# Patient Record
Sex: Female | Born: 1975 | Race: White | Hispanic: No | Marital: Single | Smoking: Never smoker
Health system: Southern US, Community
[De-identification: ages and names within clinical notes are randomized; demographics above are authoritative.]

## PROBLEM LIST (undated history)

## (undated) DIAGNOSIS — E282 Polycystic ovarian syndrome: Secondary | ICD-10-CM

## (undated) DIAGNOSIS — F419 Anxiety disorder, unspecified: Secondary | ICD-10-CM

## (undated) DIAGNOSIS — E119 Type 2 diabetes mellitus without complications: Secondary | ICD-10-CM

## (undated) DIAGNOSIS — I10 Essential (primary) hypertension: Secondary | ICD-10-CM

## (undated) HISTORY — DX: Type 2 diabetes mellitus without complications: E11.9

## (undated) HISTORY — DX: Polycystic ovarian syndrome: E28.2

## (undated) HISTORY — DX: Anxiety disorder, unspecified: F41.9

## (undated) HISTORY — PX: BARTHOLIN GLAND CYST EXCISION: SHX565

## (undated) HISTORY — DX: Essential (primary) hypertension: I10

## (undated) HISTORY — PX: MOUTH SURGERY: SHX715

---

## 2005-11-10 ENCOUNTER — Emergency Department: Payer: Self-pay | Admitting: Emergency Medicine

## 2005-11-10 ENCOUNTER — Other Ambulatory Visit: Payer: Self-pay

## 2007-03-02 ENCOUNTER — Emergency Department: Payer: Self-pay | Admitting: Emergency Medicine

## 2007-03-22 ENCOUNTER — Emergency Department: Payer: Self-pay

## 2012-04-07 ENCOUNTER — Emergency Department: Payer: Self-pay | Admitting: Emergency Medicine

## 2012-04-07 LAB — CK TOTAL AND CKMB (NOT AT ARMC): CK-MB: 0.6 ng/mL (ref 0.5–3.6)

## 2012-04-07 LAB — COMPREHENSIVE METABOLIC PANEL
Alkaline Phosphatase: 59 U/L (ref 50–136)
Anion Gap: 9 (ref 7–16)
BUN: 13 mg/dL (ref 7–18)
Bilirubin,Total: 0.3 mg/dL (ref 0.2–1.0)
Chloride: 104 mmol/L (ref 98–107)
Co2: 28 mmol/L (ref 21–32)
Creatinine: 0.75 mg/dL (ref 0.60–1.30)
EGFR (Non-African Amer.): >60
Osmolality: 281 (ref 275–301)
Potassium: 3.7 mmol/L (ref 3.5–5.1)
SGOT(AST): 21 U/L (ref 15–37)

## 2012-04-07 LAB — TROPONIN I: Troponin-I: <0.02 ng/mL

## 2012-04-07 LAB — CBC
MCH: 27.9 pg (ref 26.0–34.0)
MCHC: 33.8 g/dL (ref 32.0–36.0)
MCV: 82 fL (ref 80–100)
RBC: 4.4 10*6/uL (ref 3.80–5.20)
WBC: 9.9 10*3/uL (ref 3.6–11.0)

## 2013-05-31 IMAGING — CR DG CHEST 2V
1 series · 2 of 2 positions shown · non-contrast
Comparison: none

REASON FOR EXAM: chest pain
COMMENTS:

PROCEDURE:     DXR - DXR CHEST PA (OR AP) AND LATERAL  - April 07, 2012 [DATE]
RESULT:     The lung fields are clear. No pneumonia, pneumothorax or pleural
effusion is seen. Heart size is normal. The mediastinal and osseous
structures reveal no significant abnormalities.

[Series 1: w chest pa · 0.14mm/px · 2 of 2 slices shown]
[im 1/2]
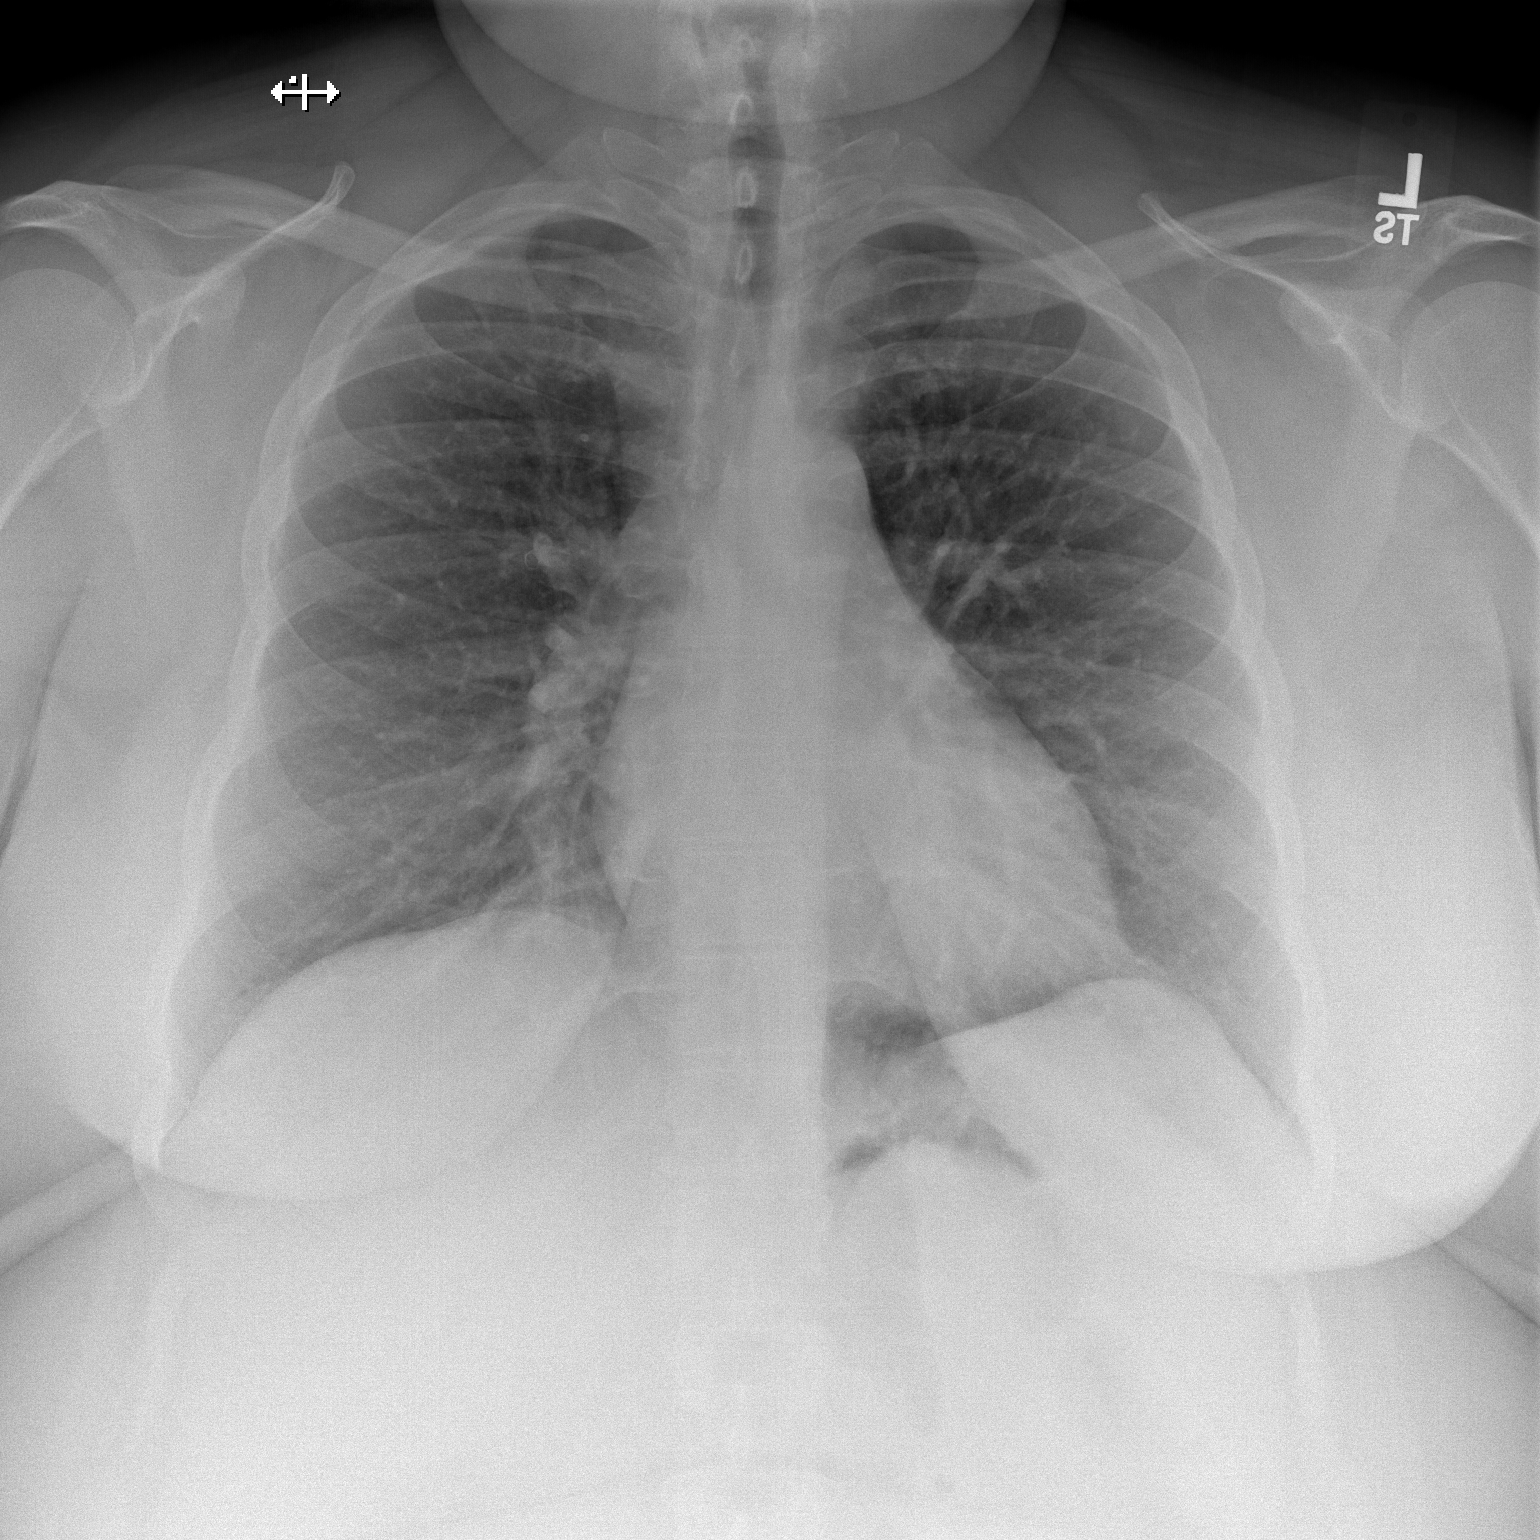
[im 2/2]
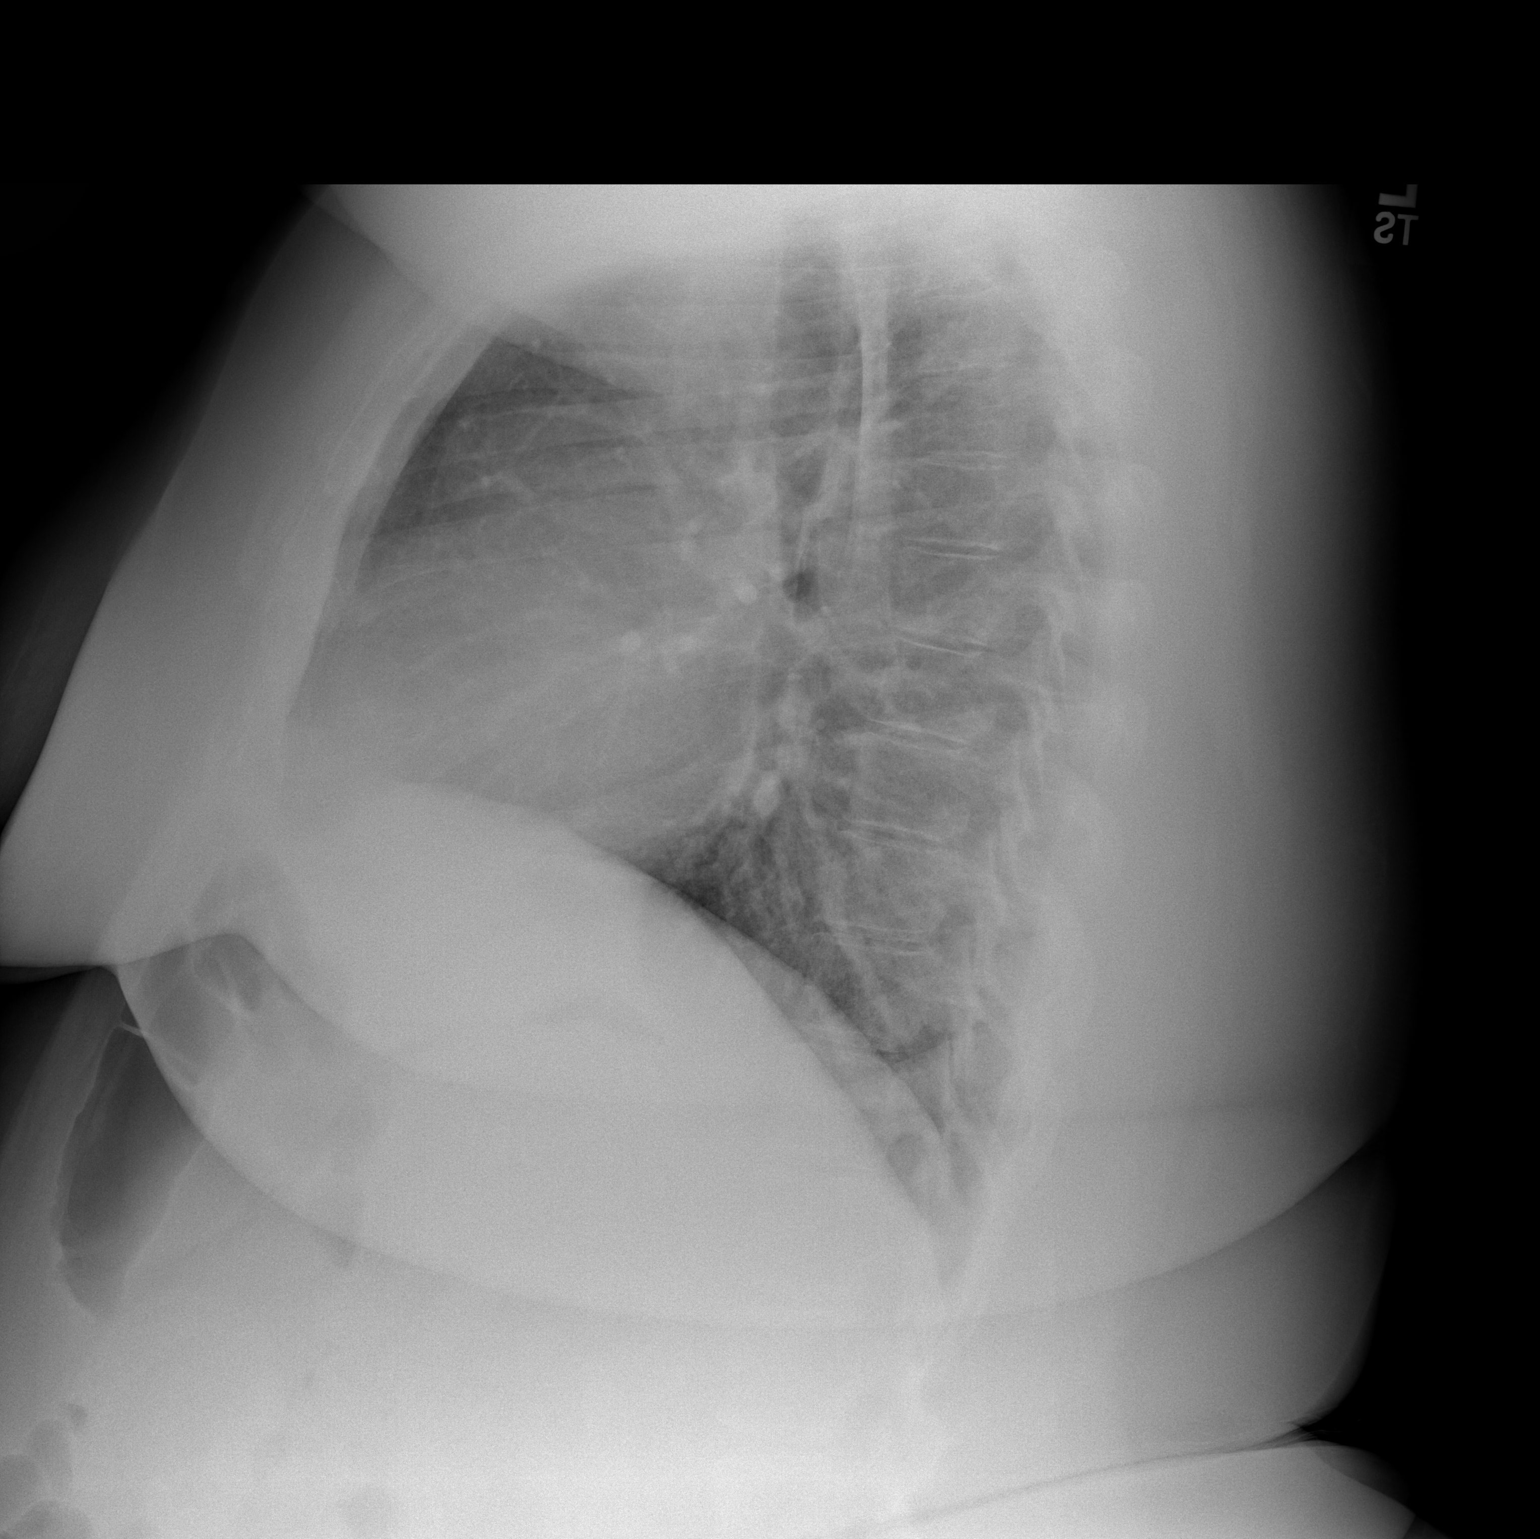

[2 of 2 positions shown; findings below may reference images not displayed]

IMPRESSION: No acute changes are identified.

## 2013-06-01 IMAGING — US ABDOMEN ULTRASOUND LIMITED
1 series · 17 of 25 positions shown · non-contrast
Comparison: none

REASON FOR EXAM: upper abd pain, chest pain - eval for biliary disease
COMMENTS:   Body Site: GB and Fossa, CBD, Head of Pancreas

[Series 1: abdomen ultrasound limited · 17 of 37 slices shown]
[im 1/37]
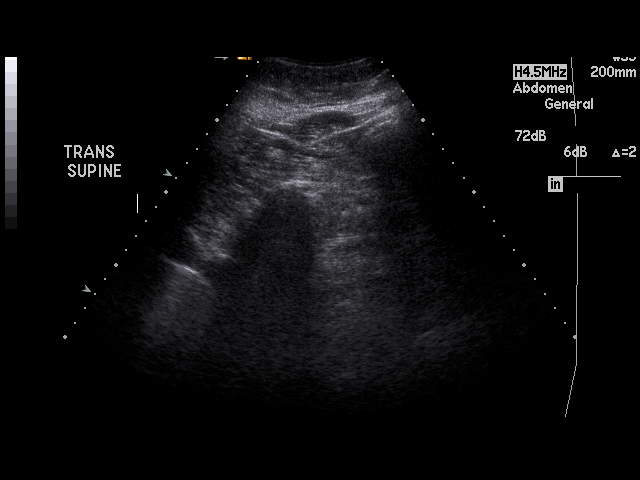
[im 4/37]
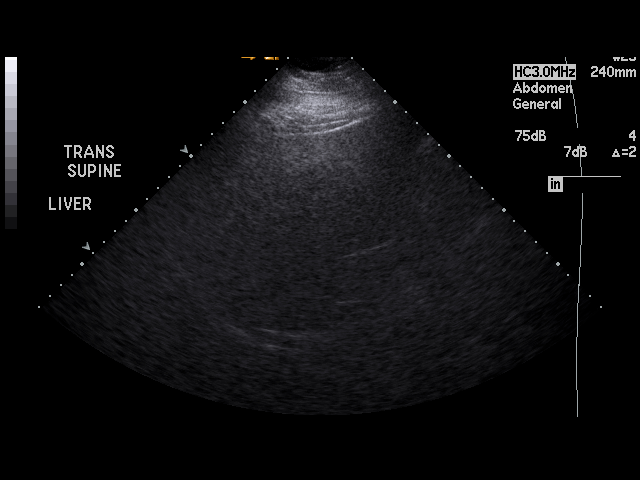
[im 5/37]
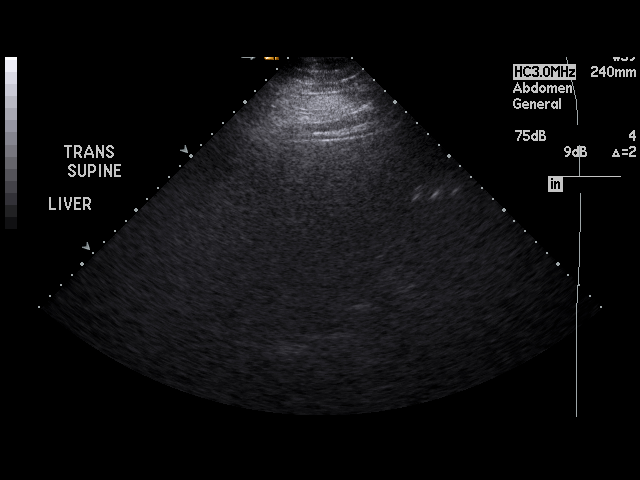
[im 8/37]
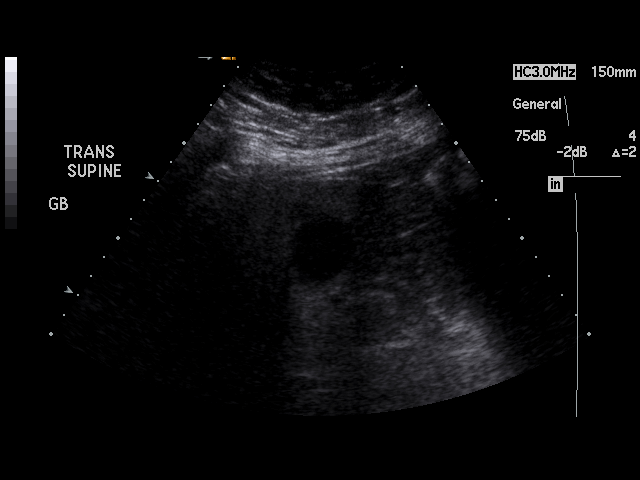
[im 10/37]
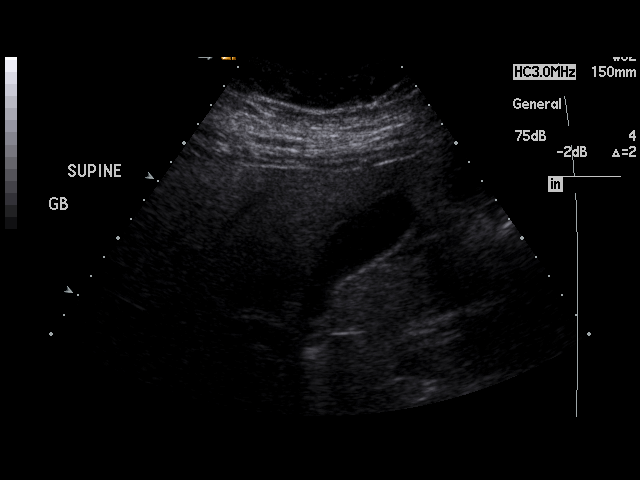
[im 13/37]
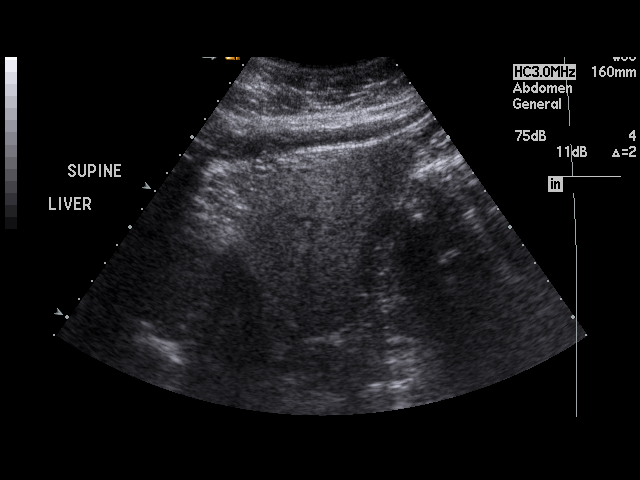
[im 14/37]
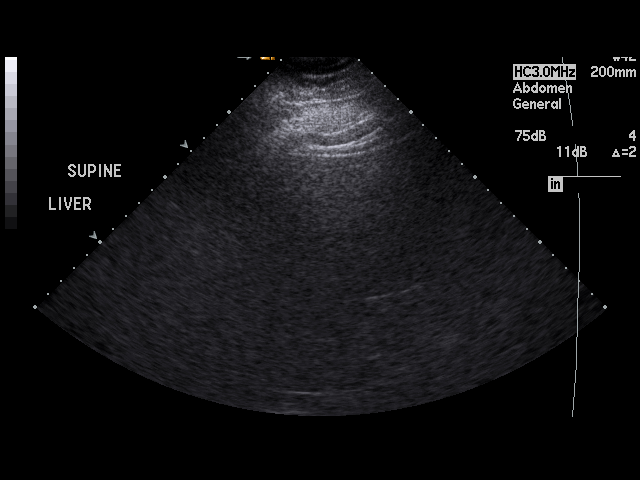
[im 17/37]
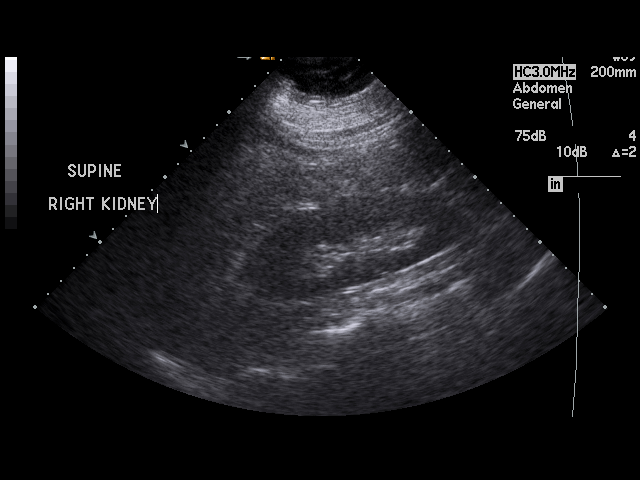
[im 19/37]
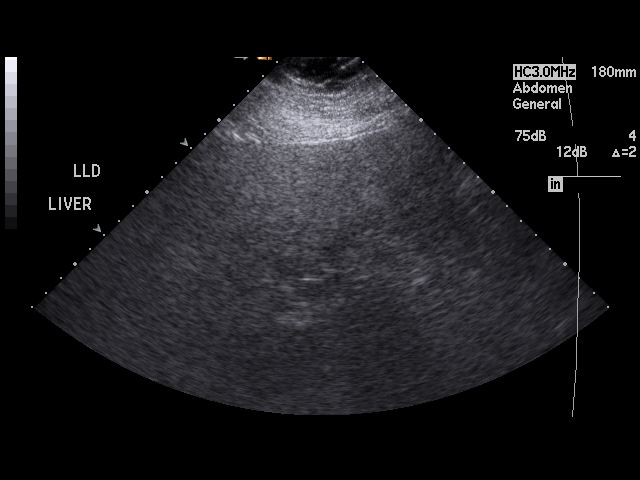
[im 20/37]
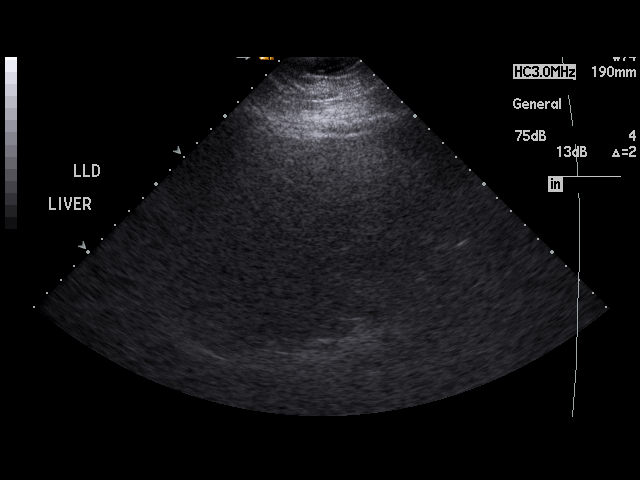
[im 23/37]
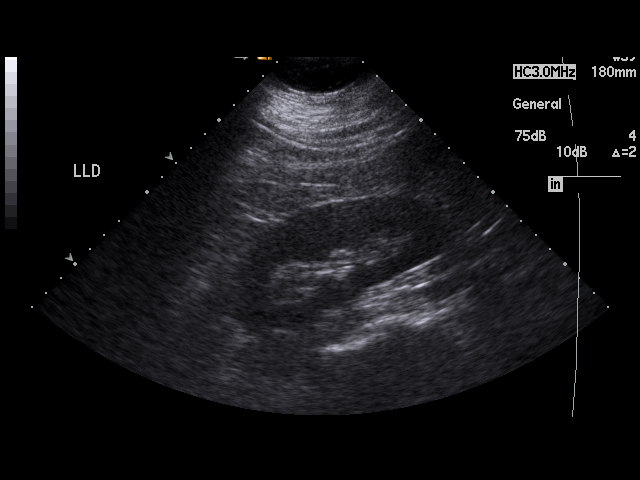
[im 25/37]
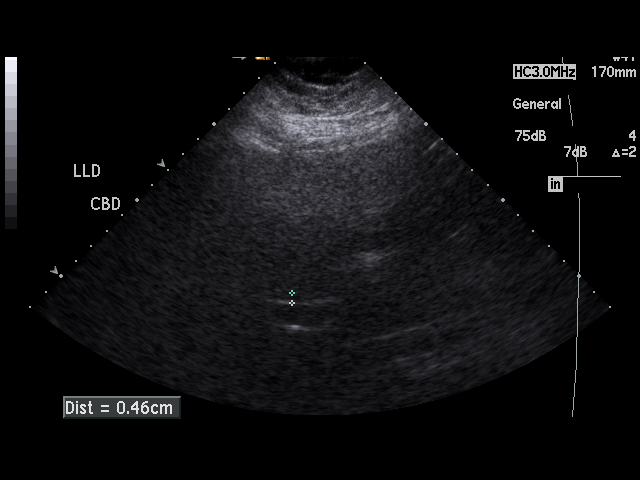
[im 28/37]
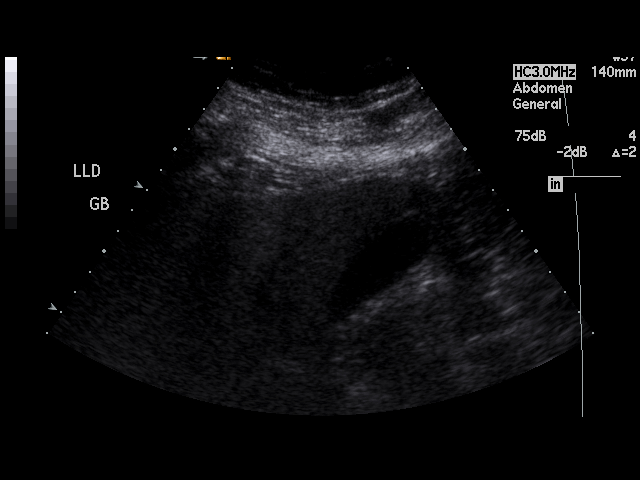
[im 29/37]
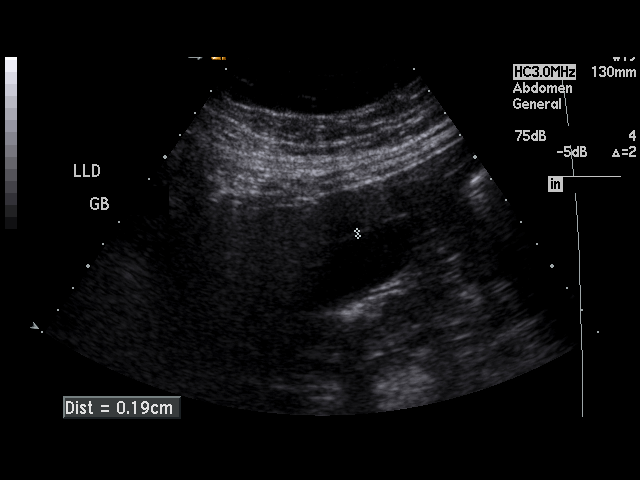
[im 32/37]
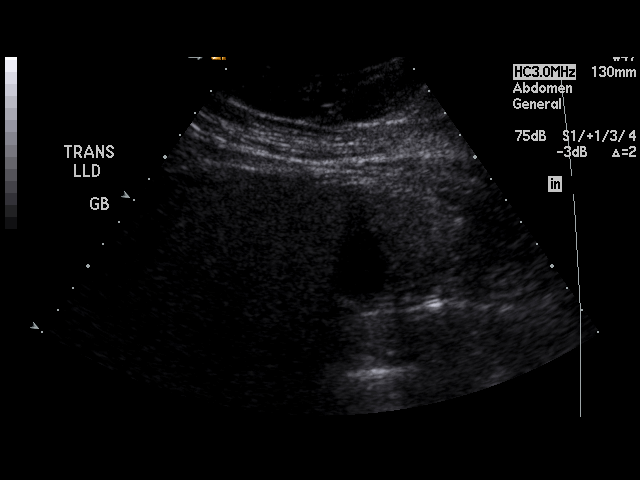
[im 34/37]
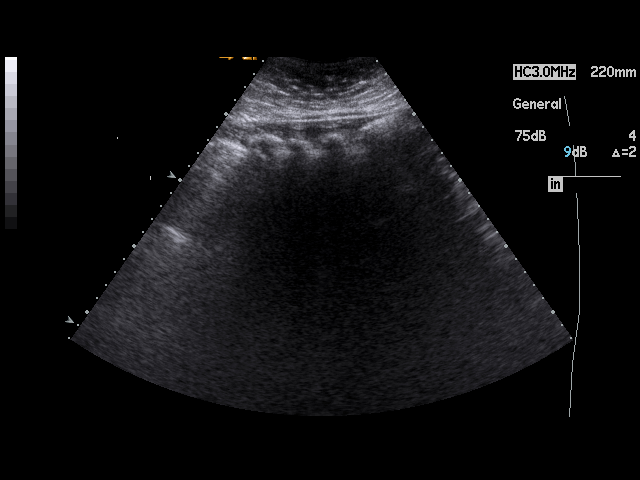
[im 37/37]
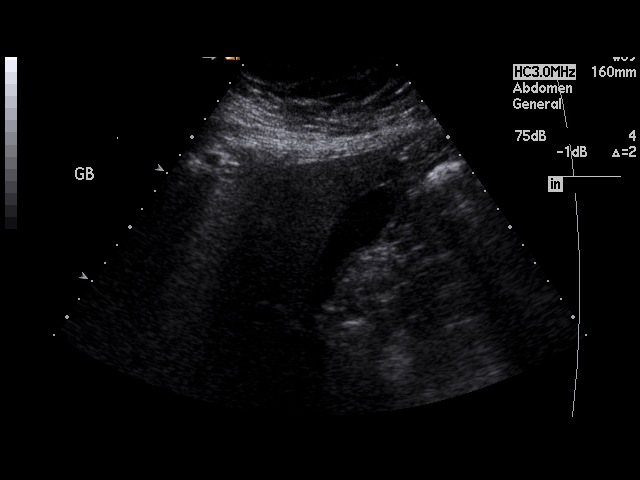

[17 of 25 positions shown; findings below may reference images not displayed]

PROCEDURE:     US  - US ABDOMEN LIMITED SURVEY  - April 08, 2012  [DATE]

RESULT:     This is a difficult exam due to patient body habitus. No
gallstones noted. Gallbladder wall thickening is normal. Negative Murphy's
sign. Common bile duct diameter 5 mm. Liver is echogenic consistent with fat
infiltration. Pancreas not seen. Right kidney unremarkable.
IMPRESSION: No acute abnormality.

## 2013-10-16 ENCOUNTER — Ambulatory Visit: Payer: Self-pay | Admitting: Family Medicine

## 2015-12-28 ENCOUNTER — Ambulatory Visit (INDEPENDENT_AMBULATORY_CARE_PROVIDER_SITE_OTHER): Payer: BC Managed Care – PPO | Admitting: Family Medicine

## 2015-12-28 ENCOUNTER — Encounter: Payer: Self-pay | Admitting: Family Medicine

## 2015-12-28 VITALS — BP 112/74 | HR 84 | Temp 98.7°F | Resp 16 | Ht 65.5 in | Wt 318.0 lb

## 2015-12-28 DIAGNOSIS — G4700 Insomnia, unspecified: Secondary | ICD-10-CM | POA: Diagnosis not present

## 2015-12-28 DIAGNOSIS — R609 Edema, unspecified: Secondary | ICD-10-CM | POA: Insufficient documentation

## 2015-12-28 DIAGNOSIS — F419 Anxiety disorder, unspecified: Secondary | ICD-10-CM

## 2015-12-28 DIAGNOSIS — E789 Disorder of lipoprotein metabolism, unspecified: Secondary | ICD-10-CM | POA: Insufficient documentation

## 2015-12-28 DIAGNOSIS — Z23 Encounter for immunization: Secondary | ICD-10-CM

## 2015-12-28 DIAGNOSIS — R739 Hyperglycemia, unspecified: Secondary | ICD-10-CM | POA: Insufficient documentation

## 2015-12-28 DIAGNOSIS — R5383 Other fatigue: Secondary | ICD-10-CM | POA: Insufficient documentation

## 2015-12-28 DIAGNOSIS — G43909 Migraine, unspecified, not intractable, without status migrainosus: Secondary | ICD-10-CM | POA: Insufficient documentation

## 2015-12-28 DIAGNOSIS — F4320 Adjustment disorder, unspecified: Secondary | ICD-10-CM | POA: Insufficient documentation

## 2015-12-28 DIAGNOSIS — E559 Vitamin D deficiency, unspecified: Secondary | ICD-10-CM | POA: Insufficient documentation

## 2015-12-28 DIAGNOSIS — E282 Polycystic ovarian syndrome: Secondary | ICD-10-CM | POA: Insufficient documentation

## 2015-12-28 MED ORDER — TRAZODONE HCL 50 MG PO TABS: 50.0000 mg | ORAL_TABLET | Freq: Every day | ORAL | Status: DC

## 2015-12-28 MED ORDER — ESCITALOPRAM OXALATE 10 MG PO TABS: 10.0000 mg | ORAL_TABLET | Freq: Every day | ORAL | Status: DC

## 2015-12-28 NOTE — Progress Notes (Signed)
Patient ID: Alexis Miles, female   DOB: May 29, 1976, 40 y.o.   MRN: [id number]        Patient: Alexis Miles Female    DOB: 09/17/1976   39 y.o.   MRN: [id number] Visit Date: 12/28/2015  Today's Provider: Lorie Phenix, MD   Chief Complaint  Patient presents with  . Anxiety   Subjective:    Anxiety Presents for follow-up visit. The problem has been gradually worsening (Especially in the last several weeks. ). Symptoms include decreased concentration, excessive worry, insomnia, irritability, nausea, nervous/anxious behavior and palpitations (When she is anxious). Patient reports no chest pain, compulsions, confusion, depressed mood, dizziness, panic, restlessness, shortness of breath or suicidal ideas. Symptoms occur constantly. The severity of symptoms is interfering with daily activities. The symptoms are aggravated by work stress (Pt reports the holidays were difficult). The quality of sleep is poor. Nighttime awakenings: several.         Allergies  Allergen Reactions  . Penicillins    Previous Medications   ALPRAZOLAM (XANAX) 0.5 MG TABLET    Take by mouth. Reported on 12/28/2015   CHOLECALCIFEROL (VITAMIN D) 2000 UNITS CAPS    Take 1 capsule by mouth daily. Reported on 12/28/2015   ERGOCALCIFEROL (DRISDOL) 50000 UNITS CAPSULE    Take 1 capsule by mouth once a week. Reported on 12/28/2015   FLUOXETINE (PROZAC) 40 MG CAPSULE    Take 1 Can by mouth daily. Reported on 12/28/2015   METFORMIN (GLUCOPHAGE) 500 MG TABLET    Take 1 tablet by mouth 2 (two) times daily. Reported on 12/28/2015    Review of Systems  Constitutional: Positive for irritability and fatigue. Negative for fever, chills, diaphoresis, activity change, appetite change and unexpected weight change.  Respiratory: Negative.  Negative for cough, choking, shortness of breath, wheezing and stridor.   Cardiovascular: Positive for palpitations (When she is anxious). Negative for chest pain and leg swelling.    Gastrointestinal: Positive for nausea, abdominal pain, diarrhea and constipation. Negative for vomiting, blood in stool, abdominal distention, anal bleeding and rectal pain.  Endocrine: Positive for cold intolerance and heat intolerance.  Neurological: Positive for light-headedness. Negative for dizziness and headaches.  Psychiatric/Behavioral: Positive for sleep disturbance and decreased concentration. Negative for suicidal ideas, hallucinations, behavioral problems, confusion, self-injury, dysphoric mood and agitation. The patient is nervous/anxious and has insomnia. The patient is not hyperactive.     Social History  Substance Use Topics  . Smoking status: Never Smoker   . Smokeless tobacco: Never Used  . Alcohol Use: No   Objective:   BP 112/74 mmHg  Pulse 84  Temp(Src) 98.7 F (37.1 C) (Oral)  Resp 16  Ht 5' 5.5" (1.664 m)  Wt 318 lb (144.244 kg)  BMI 52.09 kg/m2  LMP 11/27/2015 (Within Weeks)  Physical Exam  Constitutional: She is oriented to person, place, and time. She appears well-developed and well-nourished.  Cardiovascular: Normal rate and regular rhythm.   Pulmonary/Chest: Effort normal and breath sounds normal.  Neurological: She is alert and oriented to person, place, and time.  Psychiatric: She has a normal mood and affect. Her behavior is normal. Judgment and thought content normal.  Tearful when talking about her mother.        Assessment & Plan:     1. Flu vaccine need Given today.   - Flu Vaccine QUAD 36+ mos PF IM (Fluarix & Fluzone Quad PF)  2. Anxiety Recurrent. Will start medication as below. Patient instructed to call back if condition worsens  or does not improve.   Recheck in 4 weeks.  - escitalopram (LEXAPRO) 10 MG tablet; Take 1 tablet (10 mg total) by mouth daily. Start with  a day for seven day, then increase to  a day.  Dispense: 30 tablet; Refill: 5 - traZODone (DESYREL) 50 MG tablet; Take 1 tablet (50 mg total) by mouth at bedtime.   Dispense: 30 tablet; Refill: 5  3. Insomnia Will start Trazodone at night.    - escitalopram (LEXAPRO) 10 MG tablet; Take 1 tablet (10 mg total) by mouth daily. Start with  a day for seven day, then increase to  a day.  Dispense: 30 tablet; Refill: 5 - traZODone (DESYREL) 50 MG tablet; Take 1 tablet (50 mg total) by mouth at bedtime.  Dispense: 30 tablet; Refill: 5  Patient was seen and examined by Leo Grosser, MD, and note scribed by Kavin Leech, CMA.  I have reviewed the document for accuracy and completeness and I agree with above. - Leo Grosser, MD      Lorie Phenix, MD  [facility]

## 2016-01-27 ENCOUNTER — Ambulatory Visit (INDEPENDENT_AMBULATORY_CARE_PROVIDER_SITE_OTHER): Payer: BC Managed Care – PPO | Admitting: Family Medicine

## 2016-01-27 ENCOUNTER — Encounter: Payer: Self-pay | Admitting: Family Medicine

## 2016-01-27 VITALS — BP 122/78 | HR 68 | Temp 98.3°F | Resp 16 | Wt 317.0 lb

## 2016-01-27 DIAGNOSIS — F419 Anxiety disorder, unspecified: Secondary | ICD-10-CM

## 2016-01-27 MED ORDER — ESCITALOPRAM OXALATE 10 MG PO TABS: 15.0000 mg | ORAL_TABLET | Freq: Every day | ORAL | Status: DC

## 2016-01-27 NOTE — Progress Notes (Signed)
Subjective:    Patient ID: Alexis Miles, female    DOB: [birth year], 40 y.o.   MRN: [id number]  Anxiety Presents for follow-up (Started on Lexapro at LOV) visit. The problem has been gradually improving (pt reports thi sproblem is 75% improved since LOV). Symptoms include decreased concentration, depressed mood, nausea, nervous/anxious behavior and restlessness. Patient reports no chest pain, compulsions, confusion, dizziness, dry mouth, excessive worry, feeling of choking, hyperventilation, insomnia, irritability, malaise, muscle tension, obsessions, palpitations, panic, shortness of breath or suicidal ideas. The severity of symptoms is mild. Exacerbated by: particular stressors. The quality of sleep is good.   Past treatments include SSRIs. The treatment provided significant relief. Compliance with prior treatments has been good.  Insomnia Primary symptoms: no fragmented sleep, sleep disturbance (wakes up about once during the night, has improved since LOV), difficulty falling asleep (improved), somnolence, no frequent awakening, premature morning awakening, malaise/fatigue, no napping.   Episode onset: FU from 12/28/2015. Pt was started on Trazodone at LOV. The problem has been gradually improving since onset. Past treatments include medication (Trazodone). The treatment provided no relief (pt reports the Trazodone was causing side effects, including suididal ideation). Typical bedtime:  10-11 P.M..       Review of Systems  Constitutional: Positive for malaise/fatigue. Negative for irritability.  Respiratory: Negative for shortness of breath.   Cardiovascular: Negative for chest pain and palpitations.  Gastrointestinal: Positive for nausea.  Neurological: Negative for dizziness.  Psychiatric/Behavioral: Positive for sleep disturbance (wakes up about once during the night, has improved since LOV) and decreased concentration. Negative for suicidal ideas and confusion. The patient is  nervous/anxious. The patient does not have insomnia.    BP 122/78 mmHg  Pulse 68  Temp(Src) 98.3 F (36.8 C) (Oral)  Resp 16  Wt 317 lb (143.79 kg)  LMP 01/01/2016 (Within Days)   Patient Active Problem List   Diagnosis Date Noted  . Anxiety 12/28/2015  . Borderline high serum cholesterol 12/28/2015  . Accumulation of fluid in tissues 12/28/2015  . Fatigue 12/28/2015  . Blood glucose elevated 12/28/2015  . Migraine 12/28/2015  . Polycystic ovaries 12/28/2015  . Avitaminosis D 12/28/2015  . Insomnia 12/28/2015   Past Medical History  Diagnosis Date  . Anxiety    Current Outpatient Prescriptions on File Prior to Visit  Medication Sig  . metFORMIN (GLUCOPHAGE) 500 MG tablet Take 1 tablet by mouth 2 (two) times daily. Reported on 01/27/2016   No current facility-administered medications on file prior to visit.   Allergies  Allergen Reactions  . Penicillins   . Trazodone And Nefazodone Other (See Comments)    Suicidal Ideation   Past Surgical History  Procedure Laterality Date  . Mouth surgery      wisdom teeth removed   Social History   Social History  . Marital Status: Single    Spouse Name: N/A  . Number of Children: 0  . Years of Education: College   Occupational History  . Full Time    Social History Main Topics  . Smoking status: Never Smoker   . Smokeless tobacco: Never Used  . Alcohol Use: No  . Drug Use: No  . Sexual Activity: Not on file   Other Topics Concern  . Not on file   Social History Narrative   Family History  Problem Relation Age of Onset  . Arthritis Mother   . Lymphoma Mother   . Heart disease Father     MI, CABG, CAD  . Diabetes Father   .  Hyperlipidemia Sister   . Depression Sister   . Anxiety disorder Sister   . Cancer Maternal Grandmother     colon, breast   . Diabetes Maternal Grandmother   . Alcohol abuse Paternal Grandfather   . Hypertension Sister   . Depression Sister   . Anxiety disorder Sister   . Diabetes  Maternal Aunt   . Lymphoma Maternal Aunt       Objective:   Physical Exam  Constitutional: She is oriented to person, place, and time. She appears well-developed and well-nourished.  Neurological: She is alert and oriented to person, place, and time.  Psychiatric: She has a normal mood and affect. Her behavior is normal. Judgment and thought content normal.   BP 122/78 mmHg  Pulse 68  Temp(Src) 98.3 F (36.8 C) (Oral)  Resp 16  Wt 317 lb (143.79 kg)  LMP 01/01/2016 (Within Days)     Assessment & Plan:  1. Anxiety Improved but not at goal.  Will increase to 15 mg and recheck at CPE.    - escitalopram (LEXAPRO) 10 MG tablet; Take 1.5 tablets (15 mg total) by mouth daily.  Dispense: 45 tablet; Refill: 5   Patient was seen and examined by Leo Grosser, MD, and note scribed by Allene Dillon, CMA. I have reviewed the document for accuracy and completeness and I agree with above. Leo Grosser, MD  Lorie Phenix, MD

## 2016-02-24 ENCOUNTER — Ambulatory Visit: Payer: BC Managed Care – PPO

## 2016-03-01 ENCOUNTER — Other Ambulatory Visit: Payer: Self-pay | Admitting: Family Medicine

## 2016-03-01 DIAGNOSIS — F419 Anxiety disorder, unspecified: Secondary | ICD-10-CM

## 2016-03-01 MED ORDER — ESCITALOPRAM OXALATE 10 MG PO TABS: 15.0000 mg | ORAL_TABLET | Freq: Every day | ORAL | Status: DC

## 2016-03-01 NOTE — Telephone Encounter (Signed)
Pt said you had increased her escitalopram (LEXAPRO) 10 MG tablet To 15 mg .    She needs an early refill because of the increase.  She has ran out.  She has been taking 1 and 1/2 tablets.  She uses [facility] [address]  Her call back is at work and is [phone]  Thanks Barth Kirkseri

## 2016-03-12 ENCOUNTER — Encounter: Payer: BC Managed Care – PPO | Admitting: Family Medicine

## 2016-05-04 ENCOUNTER — Encounter: Payer: Self-pay | Admitting: Family Medicine

## 2016-05-04 ENCOUNTER — Ambulatory Visit (INDEPENDENT_AMBULATORY_CARE_PROVIDER_SITE_OTHER): Payer: BC Managed Care – PPO | Admitting: Family Medicine

## 2016-05-04 VITALS — BP 128/72 | HR 80 | Temp 98.1°F | Resp 16 | Ht 65.0 in | Wt 333.0 lb

## 2016-05-04 DIAGNOSIS — F419 Anxiety disorder, unspecified: Secondary | ICD-10-CM | POA: Diagnosis not present

## 2016-05-04 DIAGNOSIS — R739 Hyperglycemia, unspecified: Secondary | ICD-10-CM

## 2016-05-04 DIAGNOSIS — E559 Vitamin D deficiency, unspecified: Secondary | ICD-10-CM | POA: Diagnosis not present

## 2016-05-04 DIAGNOSIS — E789 Disorder of lipoprotein metabolism, unspecified: Secondary | ICD-10-CM | POA: Diagnosis not present

## 2016-05-04 DIAGNOSIS — Z0000 Encounter for general adult medical examination without abnormal findings: Secondary | ICD-10-CM | POA: Diagnosis not present

## 2016-05-04 NOTE — Progress Notes (Signed)
Patient: Alexis Miles, Female    DOB: [birth year], 40 y.o.   MRN: [id number] Visit Date: 05/04/2016  Today's Provider: Lorie PhenixNancy Dinora Hemm, MD   Chief Complaint  Patient presents with  . Annual Exam  . Anxiety   Subjective:    Annual physical exam Alexis Miles is a 40 y.o. female who presents today for health maintenance and complete physical. She feels fairly well. She reports exercising never. She reports she is sleeping fairly well.  -----------------------------------------------------------------  Follow up for Anxiety  The patient was last seen for this 3 months ago. Changes made at last visit include increasing Lexapro to 15 mg.  She reports excellent compliance with treatment. She feels that condition is Improved. Pt reports she is 90% improved. She is not having side effects.   ------------------------------------------------------------------------------------      Review of Systems  Constitutional: Positive for fatigue.  Respiratory: Positive for cough (improving).   Genitourinary: Positive for urgency.  Neurological: Positive for headaches.  Psychiatric/Behavioral: The patient is nervous/anxious.   All other systems reviewed and are negative.   Social History      She  reports that she has never smoked. She has never used smokeless tobacco. She reports that she drinks alcohol. She reports that she does not use illicit drugs.       Social History   Social History  . Marital Status: Single    Spouse Name: N/A  . Number of Children: 0  . Years of Education: College   Occupational History  . Full Time Abss    Engineer, [employer]   Social History Main Topics  . Smoking status: Never Smoker   . Smokeless tobacco: Never Used  . Alcohol Use: Yes     Comment: Rare  . Drug Use: No  . Sexual Activity: No     Comment: Never sexually active   Other Topics Concern  . None   Social History Narrative    Past Medical History    Diagnosis Date  . Anxiety      Patient Active Problem List   Diagnosis Date Noted  . Anxiety 12/28/2015  . Borderline high serum cholesterol 12/28/2015  . Accumulation of fluid in tissues 12/28/2015  . Fatigue 12/28/2015  . Blood glucose elevated 12/28/2015  . Migraine 12/28/2015  . Polycystic ovaries 12/28/2015  . Avitaminosis D 12/28/2015  . Insomnia 12/28/2015    Past Surgical History  Procedure Laterality Date  . Mouth surgery      wisdom teeth removed  . Bartholin gland cyst excision      Family History        Family Status  Relation Status Death Age  . Mother Deceased   . Father Alive   . Sister Alive   . Maternal Grandmother Deceased   . Paternal Grandfather Deceased     MI  . Sister Alive         Her family history includes Alcohol abuse in her paternal grandfather; Anxiety disorder in her sister and sister; Arthritis in her mother; Cancer in her maternal grandmother; Depression in her sister and sister; Diabetes in her father, maternal aunt, and maternal grandmother; Heart disease in her father; Hyperlipidemia in her sister; Hypertension in her sister; Lymphoma in her maternal aunt and mother.    Allergies  Allergen Reactions  . Penicillins   . Trazodone And Nefazodone Other (See Comments)    Suicidal Ideation    Previous Medications   ACETAMINOPHEN (TYLENOL)  500 MG TABLET    Take 500 mg by mouth every 6 (six) hours as needed.   ESCITALOPRAM (LEXAPRO) 10 MG TABLET    Take 1.5 tablets (15 mg total) by mouth daily.    Patient Care Team: Lorie Phenix, MD as PCP - General (Family Medicine)     Objective:   Vitals: BP 128/72 mmHg  Pulse 80  Temp(Src) 98.1 F (36.7 C) (Oral)  Resp 16  Ht  (1.651 m)  Wt 333 lb (151.048 kg)  BMI 55.41 kg/m2  LMP 04/30/2016   Physical Exam  Constitutional: She is oriented to person, place, and time. She appears well-developed and well-nourished.  HENT:  Head: Normocephalic and atraumatic.  Right Ear:  Tympanic membrane, external ear and ear canal normal.  Left Ear: Tympanic membrane, external ear and ear canal normal.  Nose: Nose normal.  Mouth/Throat: Uvula is midline, oropharynx is clear and moist and mucous membranes are normal.  Eyes: Conjunctivae, EOM and lids are normal. Pupils are equal, round, and reactive to light.  Neck: Trachea normal and normal range of motion. Neck supple. Carotid bruit is not present. No thyroid mass and no thyromegaly present.  Cardiovascular: Normal rate, regular rhythm and normal heart sounds.   Pulmonary/Chest: Effort normal and breath sounds normal.  Abdominal: Soft. Normal appearance and bowel sounds are normal. There is no hepatosplenomegaly. There is no tenderness.  Musculoskeletal: Normal range of motion.  Lymphadenopathy:    She has no cervical adenopathy.    She has no axillary adenopathy.  Neurological: She is alert and oriented to person, place, and time. She has normal strength. No cranial nerve deficit.  Skin: Skin is warm, dry and intact.  Psychiatric: She has a normal mood and affect. Her speech is normal and behavior is normal. Judgment and thought content normal. Cognition and memory are normal.     Depression Screen PHQ 2/9 Scores 05/04/2016  PHQ - 2 Score 1      Assessment & Plan:     Routine Health Maintenance and Physical Exam  Exercise Activities and Dietary recommendations Goals    None      Immunization History  Administered Date(s) Administered  . Influenza,inj,Quad PF,36+ Mos 12/28/2015    Health Maintenance  Topic Date Due  . HIV Screening  05/28/1993  . TETANUS/TDAP  05/28/1997  . PAP SMEAR  05/29/1999  . INFLUENZA VACCINE  07/10/2016      Discussed health benefits of physical activity, and encouraged her to engage in regular exercise appropriate for her age and condition.    -------------------------------------------------------------------- 1. Annual physical exam Stable. As above. Will defer pap.  Has never been sexually active. Has had 2 normal paps in the past.  2. Anxiety Improving. Continue 15 mg of Lexapro daily. Call if sx worsen. - TSH  3. Avitaminosis D Pt has H/O this. Not taking supplements. FU pending results. - VITAMIN D 25 Hydroxy (Vit-D Deficiency, Fractures)  4. Blood glucose elevated Pt has H/O this. No longer taking Metformin. FU pending results. - Hemoglobin A1c  5. Borderline high serum cholesterol Pt has H/O this. FU pending results. - Lipid panel - CBC with Differential/Platelet - Comprehensive metabolic panel   6. Morbid obesity, unspecified obesity type (HCC) Stressed importance of weight loss. Recommended think about bypass surgery.    Patient seen and examined by Leo Grosser, MD, and note scribed by Allene Dillon, CMA.  I have reviewed the document for accuracy and completeness and I agree with above. Harriett Sine  Adele Barthel, MD   Lorie Phenix, MD

## 2016-05-22 ENCOUNTER — Telehealth: Payer: Self-pay

## 2016-05-22 LAB — COMPREHENSIVE METABOLIC PANEL
A/G RATIO: 1.4 (ref 1.2–2.2)
ALT: 22 IU/L (ref 0–32)
AST: 14 IU/L (ref 0–40)
Albumin: 4.1 g/dL (ref 3.5–5.5)
Alkaline Phosphatase: 60 IU/L (ref 39–117)
BUN / CREAT RATIO: 21 (ref 9–23)
BUN: 15 mg/dL (ref 6–20)
Bilirubin Total: 0.6 mg/dL (ref 0.0–1.2)
CALCIUM: 9.3 mg/dL (ref 8.7–10.2)
CHLORIDE: 101 mmol/L (ref 96–106)
CO2: 26 mmol/L (ref 18–29)
Creatinine, Ser: 0.72 mg/dL (ref 0.57–1.00)
GFR calc non Af Amer: 107 mL/min/{1.73_m2} (ref >59)
GFR, EST AFRICAN AMERICAN: 124 mL/min/{1.73_m2} (ref >59)
GLOBULIN, TOTAL: 2.9 g/dL (ref 1.5–4.5)
Glucose: 102 mg/dL — ABNORMAL HIGH (ref 65–99)
POTASSIUM: 4.8 mmol/L (ref 3.5–5.2)
Sodium: 141 mmol/L (ref 134–144)
Total Protein: 7 g/dL (ref 6.0–8.5)

## 2016-05-22 LAB — CBC WITH DIFFERENTIAL/PLATELET
BASOS: 0 %
Basophils Absolute: 0 10*3/uL (ref 0.0–0.2)
EOS (ABSOLUTE): 0.1 10*3/uL (ref 0.0–0.4)
EOS: 1 %
HEMATOCRIT: 39.8 % (ref 34.0–46.6)
Hemoglobin: 13.2 g/dL (ref 11.1–15.9)
IMMATURE GRANULOCYTES: 0 %
Immature Grans (Abs): 0 10*3/uL (ref 0.0–0.1)
LYMPHS ABS: 2.2 10*3/uL (ref 0.7–3.1)
Lymphs: 32 %
MCH: 27 pg (ref 26.6–33.0)
MCHC: 33.2 g/dL (ref 31.5–35.7)
MCV: 82 fL (ref 79–97)
MONOS ABS: 0.5 10*3/uL (ref 0.1–0.9)
Monocytes: 8 %
NEUTROS ABS: 3.9 10*3/uL (ref 1.4–7.0)
Neutrophils: 59 %
Platelets: 289 10*3/uL (ref 150–379)
RBC: 4.88 x10E6/uL (ref 3.77–5.28)
RDW: 14.5 % (ref 12.3–15.4)
WBC: 6.7 10*3/uL (ref 3.4–10.8)

## 2016-05-22 LAB — LIPID PANEL
CHOL/HDL RATIO: 2.9 ratio (ref 0.0–4.4)
Cholesterol, Total: 192 mg/dL (ref 100–199)
HDL: 66 mg/dL (ref >39)
LDL CALC: 106 mg/dL — AB (ref 0–99)
Triglycerides: 98 mg/dL (ref 0–149)
VLDL CHOLESTEROL CAL: 20 mg/dL (ref 5–40)

## 2016-05-22 LAB — TSH: TSH: 2.13 u[IU]/mL (ref 0.450–4.500)

## 2016-05-22 LAB — VITAMIN D 25 HYDROXY (VIT D DEFICIENCY, FRACTURES): Vit D, 25-Hydroxy: 18.4 ng/mL — ABNORMAL LOW (ref 30.0–100.0)

## 2016-05-22 LAB — HEMOGLOBIN A1C
Est. average glucose Bld gHb Est-mCnc: 114 mg/dL
Hgb A1c MFr Bld: 5.6 % (ref 4.8–5.6)

## 2016-05-22 NOTE — Telephone Encounter (Signed)
lmtcb Neida Ellegood Drozdowski, CMA  

## 2016-05-22 NOTE — Telephone Encounter (Signed)
-----   Message from Lorie PhenixNancy Maloney, MD sent at 05/22/2016  7:23 AM EDT ----- Labs stable except for low vit D. Take Vit D 2000 iu daily. Thanks.

## 2016-05-22 NOTE — Telephone Encounter (Signed)
Patient advised as below.  

## 2017-02-07 ENCOUNTER — Encounter: Payer: Self-pay | Admitting: Physician Assistant

## 2017-02-07 ENCOUNTER — Ambulatory Visit (INDEPENDENT_AMBULATORY_CARE_PROVIDER_SITE_OTHER): Payer: BC Managed Care – PPO | Admitting: Physician Assistant

## 2017-02-07 VITALS — BP 128/80 | HR 80 | Temp 98.0°F | Resp 16 | Wt 358.0 lb

## 2017-02-07 DIAGNOSIS — R7303 Prediabetes: Secondary | ICD-10-CM

## 2017-02-07 DIAGNOSIS — E282 Polycystic ovarian syndrome: Secondary | ICD-10-CM

## 2017-02-07 NOTE — Patient Instructions (Signed)
Diabetes Mellitus and Exercise Exercising regularly is important for your overall health, especially when you have diabetes (diabetes mellitus). Exercising is not only about losing weight. It has many health benefits, such as increasing muscle strength and bone density and reducing body fat and stress. This leads to improved fitness, flexibility, and endurance, all of which result in better overall health. Exercise has additional benefits for people with diabetes, including:  Reducing appetite.  Helping to lower and control blood glucose.  Lowering blood pressure.  Helping to control amounts of fatty substances (lipids) in the blood, such as cholesterol and triglycerides.  Helping the body to respond better to insulin (improving insulin sensitivity).  Reducing how much insulin the body needs.  Decreasing the risk for heart disease by:  Lowering cholesterol and triglyceride levels.  Increasing the levels of good cholesterol.  Lowering blood glucose levels. What is my activity plan? Your health care provider or certified diabetes educator can help you make a plan for the type and frequency of exercise (activity plan) that works for you. Make sure that you:  Do at least 150 minutes of moderate-intensity or vigorous-intensity exercise each week. This could be brisk walking, biking, or water aerobics.  Do stretching and strength exercises, such as yoga or weightlifting, at least 2 times a week.  Spread out your activity over at least 3 days of the week.  Get some form of physical activity every day.  Do not go more than 2 days in a row without some kind of physical activity.  Avoid being inactive for more than 90 minutes at a time. Take frequent breaks to walk or stretch.  Choose a type of exercise or activity that you enjoy, and set realistic goals.  Start slowly, and gradually increase the intensity of your exercise over time. What do I need to know about managing my  diabetes?  Check your blood glucose before and after exercising.  If your blood glucose is higher than 240 mg/dL (13.3 mmol/L) before you exercise, check your urine for ketones. If you have ketones in your urine, do not exercise until your blood glucose returns to normal.  Know the symptoms of low blood glucose (hypoglycemia) and how to treat it. Your risk for hypoglycemia increases during and after exercise. Common symptoms of hypoglycemia can include:  Hunger.  Anxiety.  Sweating and feeling clammy.  Confusion.  Dizziness or feeling light-headed.  Increased heart rate or palpitations.  Blurry vision.  Tingling or numbness around the mouth, lips, or tongue.  Tremors or shakes.  Irritability.  Keep a rapid-acting carbohydrate snack available before, during, and after exercise to help prevent or treat hypoglycemia.  Avoid injecting insulin into areas of the body that are going to be exercised. For example, avoid injecting insulin into:  The arms, when playing tennis.  The legs, when jogging.  Keep records of your exercise habits. Doing this can help you and your health care provider adjust your diabetes management plan as needed. Write down:  Food that you eat before and after you exercise.  Blood glucose levels before and after you exercise.  The type and amount of exercise you have done.  When your insulin is expected to peak, if you use insulin. Avoid exercising at times when your insulin is peaking.  When you start a new exercise or activity, work with your health care provider to make sure the activity is safe for you, and to adjust your insulin, medicines, or food intake as needed.  Drink plenty   of water while you exercise to prevent dehydration or heat stroke. Drink enough fluid to keep your urine clear or pale yellow. This information is not intended to replace advice given to you by your health care provider. Make sure you discuss any questions you have with  your health care provider. Document Released: 02/16/2004 Document Revised: 06/15/2016 Document Reviewed: 05/07/2016 Elsevier Interactive Patient Education  2017 [employer]  

## 2017-02-07 NOTE — Progress Notes (Signed)
Patient: Alexis Miles Female    DOB: 06/26/1976   41 y.o.   MRN: [id number] Visit Date: 02/07/2017  Today's Provider: Trey SailorsAdriana M Pollak, PA-C   Chief Complaint  Patient presents with  . Dizziness  . Nausea   Subjective:    HPI   Pt is a 41 y/o woman with history of morbid obesity (BMI 59.57) and PCOS who comes in today complaining of an episode of dizziness, nausea, chills, and sweats.  She was concerned she was having a low blood sugar. The school nurse took her glucose and it was 73.  After eating a snack she reports some improvement in her symptoms.  She says she has had similar episodes over the last several months. She has previously been on Metformin. A1c today was 6.3, up from 5.6 on 05/22/2016.        Allergies  Allergen Reactions  . Penicillins   . Trazodone And Nefazodone Other (See Comments)    Suicidal Ideation     Current Outpatient Prescriptions:  .  acetaminophen (TYLENOL) 500 MG tablet, Take 500 mg by mouth every 6 (six) hours as needed., Disp: , Rfl:  .  escitalopram (LEXAPRO) 10 MG tablet, Take 1.5 tablets (15 mg total) by mouth daily., Disp: 45 tablet, Rfl: 5  Review of Systems  Constitutional: Positive for chills and diaphoresis. Negative for activity change, appetite change, fatigue, fever and unexpected weight change.  Respiratory: Negative.   Cardiovascular: Negative.   Gastrointestinal: Positive for nausea. Negative for abdominal distention, abdominal pain, anal bleeding, blood in stool, constipation, diarrhea, rectal pain and vomiting.  Musculoskeletal: Negative.   Neurological: Positive for light-headedness and headaches. Negative for dizziness, seizures, syncope and weakness.    Social History  Substance Use Topics  . Smoking status: Never Smoker  . Smokeless tobacco: Never Used  . Alcohol use Yes     Comment: Rare   Objective:   BP 128/80 (BP Location: Left Wrist, Patient Position: Sitting, Cuff Size: Normal)   Pulse 80   Temp 98  F (36.7 C) (Oral)   Resp 16   Wt (!) 358 lb (162.4 kg)   LMP 01/10/2017   BMI 59.57 kg/m  Vitals:   02/07/17 1351  BP: 128/80  Pulse: 80  Resp: 16  Temp: 98 F (36.7 C)  TempSrc: Oral  Weight: (!) 358 lb (162.4 kg)     Physical Exam  Constitutional: She is oriented to person, place, and time. She appears well-developed and well-nourished. No distress.  Cardiovascular: Normal rate, regular rhythm and normal heart sounds.   Pulmonary/Chest: Effort normal and breath sounds normal. No respiratory distress. She has no wheezes. She has no rales.  Abdominal: Soft. Bowel sounds are normal. She exhibits no distension and no mass. There is no tenderness. There is no rebound and no guarding.  Neurological: She is alert and oriented to person, place, and time.  Skin: Skin is warm and dry. No rash noted. No erythema.  Psychiatric: She has a normal mood and affect. Her behavior is normal.        Assessment & Plan:     1. Prediabetes  A1C today 6.3. With patient's morbid obesity, plan to start Metformin 500 mg XR after getting liver function studies and lipid panel.   - Lipid panel - Comprehensive metabolic panel  2. Morbid obesity (HCC)  See above.  - Lipid panel - Comprehensive metabolic panel  3. Polycystic ovaries  See above.  Trinna Post, PA-C  [facility]

## 2017-02-09 LAB — LIPID PANEL
Chol/HDL Ratio: 4 ratio units (ref 0.0–4.4)
Cholesterol, Total: 196 mg/dL (ref 100–199)
HDL: 49 mg/dL (ref >39)
LDL Calculated: 124 mg/dL — ABNORMAL HIGH (ref 0–99)
Triglycerides: 113 mg/dL (ref 0–149)
VLDL Cholesterol Cal: 23 mg/dL (ref 5–40)

## 2017-02-09 LAB — COMPREHENSIVE METABOLIC PANEL
ALT: 25 IU/L (ref 0–32)
AST: 16 IU/L (ref 0–40)
Albumin/Globulin Ratio: 1.2 (ref 1.2–2.2)
Albumin: 3.8 g/dL (ref 3.5–5.5)
Alkaline Phosphatase: 62 IU/L (ref 39–117)
BUN/Creatinine Ratio: 12 (ref 9–23)
BUN: 10 mg/dL (ref 6–20)
Bilirubin Total: 0.5 mg/dL (ref 0.0–1.2)
CO2: 27 mmol/L (ref 18–29)
Calcium: 9.2 mg/dL (ref 8.7–10.2)
Chloride: 99 mmol/L (ref 96–106)
Creatinine, Ser: 0.86 mg/dL (ref 0.57–1.00)
GFR calc Af Amer: 99 mL/min/{1.73_m2} (ref >59)
GFR calc non Af Amer: 86 mL/min/{1.73_m2} (ref >59)
Globulin, Total: 3.2 g/dL (ref 1.5–4.5)
Glucose: 104 mg/dL — ABNORMAL HIGH (ref 65–99)
Potassium: 4.7 mmol/L (ref 3.5–5.2)
Sodium: 140 mmol/L (ref 134–144)
Total Protein: 7 g/dL (ref 6.0–8.5)

## 2017-02-11 ENCOUNTER — Other Ambulatory Visit: Payer: Self-pay | Admitting: Physician Assistant

## 2017-02-11 DIAGNOSIS — R7303 Prediabetes: Secondary | ICD-10-CM

## 2017-02-11 DIAGNOSIS — E282 Polycystic ovarian syndrome: Secondary | ICD-10-CM

## 2017-02-11 MED ORDER — METFORMIN HCL ER 500 MG PO TB24: 500.0000 mg | ORAL_TABLET | Freq: Every day | ORAL | 0 refills | Status: DC

## 2017-02-11 NOTE — Progress Notes (Signed)
Sent in metformin, would like to see patient in 4 weeks to check on this. Sooner if symptoms such as extreme muscle aches.

## 2017-02-12 ENCOUNTER — Telehealth: Payer: Self-pay

## 2017-02-12 NOTE — Telephone Encounter (Signed)
-----   Message from Trey SailorsAdriana M Pollak, [place]PA-C sent at 02/11/2017  3:27 PM EST ----- Cholesterol looks good. Liver and kidney functions normal. Would like start Metformin 500 mg XR once daily and see her back in 4 weeks to check up on this medication. Will send this in to the pharmacy.

## 2017-02-12 NOTE — Telephone Encounter (Signed)
LMTCB 02/12/2017  Thanks,   -Zykiria Bruening  

## 2017-02-14 NOTE — Telephone Encounter (Signed)
LMTCB 02/14/2017  Thanks,   -Fiora Weill  

## 2017-02-14 NOTE — Telephone Encounter (Signed)
Pt advised.  [facility]  She will call back and schedule a follow up appointment.   Thanks,   -Vernona RiegerLaura

## 2017-03-26 ENCOUNTER — Other Ambulatory Visit: Payer: Self-pay | Admitting: Physician Assistant

## 2017-03-26 DIAGNOSIS — F419 Anxiety disorder, unspecified: Secondary | ICD-10-CM

## 2017-03-26 LAB — POCT GLYCOSYLATED HEMOGLOBIN (HGB A1C): Hemoglobin A1C: 6.3

## 2017-03-26 NOTE — Telephone Encounter (Signed)
[facility] faxed a request for the following medication. Thanks CC  escitalopram (LEXAPRO) 10 MG tablet  Take 1 and 1/2 tablets by mouth daily as directed

## 2017-03-26 NOTE — Addendum Note (Signed)
Addended by: Kavin Leech E on: 03/26/2017 11:53 AM   Modules accepted: Orders

## 2017-03-28 MED ORDER — ESCITALOPRAM OXALATE 10 MG PO TABS: 15.0000 mg | ORAL_TABLET | Freq: Every day | ORAL | 5 refills | Status: DC

## 2017-05-07 ENCOUNTER — Encounter: Payer: BC Managed Care – PPO | Admitting: Physician Assistant

## 2017-05-14 ENCOUNTER — Encounter: Payer: BC Managed Care – PPO | Admitting: Physician Assistant

## 2017-12-06 ENCOUNTER — Encounter: Payer: Self-pay | Admitting: Physician Assistant

## 2017-12-06 ENCOUNTER — Ambulatory Visit: Payer: BC Managed Care – PPO | Admitting: Physician Assistant

## 2017-12-06 VITALS — BP 128/88 | HR 84 | Temp 98.3°F | Resp 16 | Ht 65.5 in | Wt 357.0 lb

## 2017-12-06 DIAGNOSIS — J0120 Acute ethmoidal sinusitis, unspecified: Secondary | ICD-10-CM | POA: Diagnosis not present

## 2017-12-06 MED ORDER — DOXYCYCLINE HYCLATE 100 MG PO TABS: 100.0000 mg | ORAL_TABLET | Freq: Two times a day (BID) | ORAL | 0 refills | Status: DC

## 2017-12-06 NOTE — Patient Instructions (Signed)

## 2017-12-06 NOTE — Progress Notes (Signed)
Patient: Alexis Miles Female    DOB: 01-14-1976   41 y.o.   MRN: [id number] Visit Date: 12/06/2017  Today's Provider: Margaretann LovelessJennifer M Burnette, PA-C   Chief Complaint  Patient presents with  . URI   Subjective:    URI   This is a new problem. The current episode started 1 to 4 weeks ago (1 week ). The problem has been gradually worsening. There has been no fever (has felt feverish). Associated symptoms include chest pain, congestion, coughing, ear pain (occasionally), headaches, a plugged ear sensation, rhinorrhea, sinus pain and a sore throat. Pertinent negatives include no abdominal pain, nausea or wheezing. She has tried decongestant and antihistamine for the symptoms. The treatment provided mild relief.      Allergies  Allergen Reactions  . Penicillins   . Trazodone And Nefazodone Other (See Comments)    Suicidal Ideation     Current Outpatient Medications:  .  acetaminophen (TYLENOL) 500 MG tablet, Take 500 mg by mouth every 6 (six) hours as needed., Disp: , Rfl:  .  escitalopram (LEXAPRO) 10 MG tablet, Take 1.5 tablets (15 mg total) by mouth daily., Disp: 45 tablet, Rfl: 5 .  metFORMIN (GLUCOPHAGE-XR) 500 MG 24 hr tablet, Take 1 tablet (500 mg total) by mouth daily with breakfast., Disp: 90 tablet, Rfl: 0  Review of Systems  Constitutional: Negative.  Negative for fever.  HENT: Positive for congestion, ear pain (occasionally), rhinorrhea, sinus pain and sore throat. Negative for sinus pressure.   Respiratory: Positive for cough. Negative for chest tightness, shortness of breath and wheezing.   Cardiovascular: Positive for chest pain. Negative for palpitations and leg swelling.  Gastrointestinal: Negative for abdominal pain and nausea.  Neurological: Positive for headaches. Negative for dizziness.    Social History   Tobacco Use  . Smoking status: Never Smoker  . Smokeless tobacco: Never Used  Substance Use Topics  . Alcohol use: Yes    Comment: Rare    Objective:   BP 128/88 (BP Location: Right Arm, Patient Position: Sitting, Cuff Size: Large)   Pulse 84   Temp 98.3 F (36.8 C)   Resp 16   Ht 5' 5.5" (1.664 m)   Wt (!) 357 lb (161.9 kg)   SpO2 97%   BMI 58.50 kg/m  Vitals:   12/06/17 1556  BP: 128/88  Pulse: 84  Resp: 16  Temp: 98.3 F (36.8 C)  SpO2: 97%  Weight: (!) 357 lb (161.9 kg)  Height: 5' 5.5" (1.664 m)     Physical Exam  Constitutional: She appears well-developed and well-nourished. No distress.  HENT:  Head: Normocephalic and atraumatic.  Right Ear: Hearing, tympanic membrane, external ear and ear canal normal.  Left Ear: Hearing, tympanic membrane, external ear and ear canal normal.  Nose: Right sinus exhibits no maxillary sinus tenderness and no frontal sinus tenderness. Left sinus exhibits no maxillary sinus tenderness and no frontal sinus tenderness.  Mouth/Throat: Uvula is midline, oropharynx is clear and moist and mucous membranes are normal. No oropharyngeal exudate.  Ethmoid sinus tenderness  Neck: Normal range of motion. Neck supple. No tracheal deviation present. No thyromegaly present.  Cardiovascular: Normal rate, regular rhythm and normal heart sounds. Exam reveals no gallop and no friction rub.  No murmur heard. Pulmonary/Chest: Effort normal and breath sounds normal. No stridor. No respiratory distress. She has no wheezes. She has no rales.  Lymphadenopathy:    She has no cervical adenopathy.  Skin: She is not diaphoretic.  Vitals reviewed.       Assessment & Plan:     1. Acute ethmoidal sinusitis, recurrence not specified Worsening symptoms that have not responded to OTC medications. Will give doxycycline as below. Continue allergy medications. Stay well hydrated and get plenty of rest. Call if no symptom improvement or if symptoms worsen. - doxycycline (VIBRA-TABS) 100 MG tablet; Take 1 tablet (100 mg total) by mouth 2 (two) times daily.  Dispense: 20 tablet; Refill: 0        Margaretann LovelessJennifer M Burnette, PA-C  [facility]

## 2018-06-27 ENCOUNTER — Ambulatory Visit (INDEPENDENT_AMBULATORY_CARE_PROVIDER_SITE_OTHER): Payer: BC Managed Care – PPO | Admitting: Physician Assistant

## 2018-06-27 ENCOUNTER — Encounter: Payer: Self-pay | Admitting: Physician Assistant

## 2018-06-27 VITALS — BP 120/80 | HR 93 | Temp 98.4°F | Resp 16 | Ht 66.0 in | Wt 350.0 lb

## 2018-06-27 DIAGNOSIS — Z803 Family history of malignant neoplasm of breast: Secondary | ICD-10-CM | POA: Diagnosis not present

## 2018-06-27 DIAGNOSIS — E559 Vitamin D deficiency, unspecified: Secondary | ICD-10-CM

## 2018-06-27 DIAGNOSIS — F330 Major depressive disorder, recurrent, mild: Secondary | ICD-10-CM | POA: Diagnosis not present

## 2018-06-27 DIAGNOSIS — F329 Major depressive disorder, single episode, unspecified: Secondary | ICD-10-CM | POA: Insufficient documentation

## 2018-06-27 DIAGNOSIS — Z0000 Encounter for general adult medical examination without abnormal findings: Secondary | ICD-10-CM

## 2018-06-27 DIAGNOSIS — R7303 Prediabetes: Secondary | ICD-10-CM

## 2018-06-27 DIAGNOSIS — Z1329 Encounter for screening for other suspected endocrine disorder: Secondary | ICD-10-CM

## 2018-06-27 DIAGNOSIS — Z23 Encounter for immunization: Secondary | ICD-10-CM | POA: Diagnosis not present

## 2018-06-27 DIAGNOSIS — E282 Polycystic ovarian syndrome: Secondary | ICD-10-CM

## 2018-06-27 DIAGNOSIS — F32A Depression, unspecified: Secondary | ICD-10-CM | POA: Insufficient documentation

## 2018-06-27 DIAGNOSIS — E789 Disorder of lipoprotein metabolism, unspecified: Secondary | ICD-10-CM | POA: Diagnosis not present

## 2018-06-27 MED ORDER — ESCITALOPRAM OXALATE 20 MG PO TABS: 20.0000 mg | ORAL_TABLET | Freq: Every day | ORAL | 1 refills | Status: DC

## 2018-06-27 MED ORDER — METFORMIN HCL ER 500 MG PO TB24: 500.0000 mg | ORAL_TABLET | Freq: Every day | ORAL | 1 refills | Status: DC

## 2018-06-27 NOTE — Progress Notes (Signed)
Patient: Alexis Miles Demarest, Female    DOB: Feb 10, 1976, 42 y.o.   MRN: [id number] Visit Date: 06/27/2018  Today's Provider: Margaretann LovelessJennifer M Pinkie Manger, PA-C   Chief Complaint  Patient presents with  . Annual Exam   Subjective:    Annual physical exam Alexis Miles Kaylor is a 42 y.o. female who presents today for health maintenance and complete physical. She feels well. She reports exercising none. She reports she is sleeping fairly well.  Last CPE:05/04/2016 Mammogram: none-Patient with family history of breast cancer. Tdap: Today Pap: Pt reports she is virgin -----------------------------------------------------------------   Review of Systems  Constitutional: Positive for chills and fatigue.       "Crying"  HENT: Negative.   Eyes: Positive for itching.  Respiratory: Negative.   Cardiovascular: Positive for palpitations.  Gastrointestinal: Positive for constipation and diarrhea.  Endocrine: Negative.   Genitourinary: Negative.   Musculoskeletal: Negative.   Skin: Negative.   Allergic/Immunologic: Negative.   Neurological: Negative.   Hematological: Negative.   Psychiatric/Behavioral: Positive for agitation and decreased concentration. The patient is nervous/anxious.     Social History      She  reports that she has never smoked. She has never used smokeless tobacco. She reports that she drinks alcohol. She reports that she does not use drugs.       Social History   Socioeconomic History  . Marital status: Single    Spouse name: Not on file  . Number of children: 0  . Years of education: College  . Highest education level: Not on file  Occupational History  . 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 Financial resource strain: Not on file  . Food insecurity:    Worry: Not on file    Inability: Not on file  . Transportation needs:    Medical: Not on file    Non-medical: Not on file  Tobacco Use  .  Smoking status: Never Smoker  . Smokeless tobacco: Never Used  Substance and Sexual Activity  . Alcohol use: Yes    Comment: Rare  . Drug use: No  . Sexual activity: Never    Comment: Never sexually active  Lifestyle  . Physical activity:    Days per week: Not on file    Minutes per session: Not on file  . Stress: Not on file  Relationships  . Social connections:    Talks on phone: Not on file    Gets together: Not on file    Attends religious service: Not on file    Active member of club or organization: Not on file    Attends meetings of clubs or organizations: Not on file    Relationship status: Not on file  Other Topics Concern  . Not on file  Social History Narrative  . Not on file    Past Medical History:  Diagnosis Date  . Anxiety      Patient Active Problem List   Diagnosis Date Noted  . Depression 06/27/2018  . Morbid obesity (HCC) 05/04/2016  . Anxiety 12/28/2015  . Borderline high serum cholesterol 12/28/2015  . Accumulation of fluid in tissues 12/28/2015  . Fatigue 12/28/2015  . Blood glucose elevated 12/28/2015  . Migraine 12/28/2015  . PCOS (polycystic ovarian syndrome) 12/28/2015  . Avitaminosis D 12/28/2015  . Insomnia 12/28/2015    Past Surgical History:  Procedure Laterality Date  . BARTHOLIN GLAND CYST  EXCISION    . MOUTH SURGERY     wisdom teeth removed    Family History        Family Status  Relation Name Status  . Mother  Deceased  . Father  Alive  . Sister  Alive  . MGM  Deceased  . PGF  Deceased       MI  . Sister  Alive  . Mat Aunt  (Not Specified)        Her family history includes Alcohol abuse in her paternal grandfather; Anxiety disorder in her sister and sister; Arthritis in her mother; Cancer in her maternal grandmother; Depression in her sister and sister; Diabetes in her father, maternal aunt, and maternal grandmother; Heart disease in her father; Hyperlipidemia in her sister; Hypertension in her sister; Lymphoma in  her maternal aunt and mother.      Allergies  Allergen Reactions  . Penicillins   . Trazodone And Nefazodone Other (See Comments)    Suicidal Ideation     Current Outpatient Medications:  .  acetaminophen (TYLENOL) 500 MG tablet, Take 500 mg by mouth every 6 (six) hours as needed., Disp: , Rfl:  .  escitalopram (LEXAPRO) 10 MG tablet, Take 1.5 tablets (15 mg total) by mouth daily., Disp: 45 tablet, Rfl: 5 .  metFORMIN (GLUCOPHAGE-XR) 500 MG 24 hr tablet, Take 1 tablet (500 mg total) by mouth daily with breakfast., Disp: 90 tablet, Rfl: 0   Patient Care Team: Margaretann Loveless, PA-C as PCP - General (Family Medicine)      Objective:   Vitals: BP 120/80 (BP Location: Left Wrist, Patient Position: Sitting, Cuff Size: Normal)   Pulse 93   Temp 98.4 F (36.9 C) (Oral)   Resp 16   Ht 5\' 6"  (1.676 m)   Wt (!) 350 lb (158.8 kg)   LMP 06/14/2018   BMI 56.49 kg/m    Physical Exam  Constitutional: She is oriented to person, place, and time. She appears well-developed and well-nourished. No distress.  HENT:  Head: Normocephalic and atraumatic.  Right Ear: Hearing, tympanic membrane, external ear and ear canal normal.  Left Ear: Hearing, tympanic membrane, external ear and ear canal normal.  Nose: Nose normal.  Mouth/Throat: Uvula is midline, oropharynx is clear and moist and mucous membranes are normal. No oropharyngeal exudate.  Eyes: Pupils are equal, round, and reactive to light. Conjunctivae and EOM are normal. Right eye exhibits no discharge. Left eye exhibits no discharge. No scleral icterus.  Neck: Normal range of motion. Neck supple. No JVD present. No tracheal deviation present. No thyromegaly present.  Cardiovascular: Normal rate, regular rhythm, normal heart sounds and intact distal pulses. Exam reveals no gallop and no friction rub.  No murmur heard. Pulmonary/Chest: Effort normal and breath sounds normal. No respiratory distress. She has no wheezes. She has no rales.  She exhibits no tenderness. Right breast exhibits no inverted nipple, no mass, no nipple discharge, no skin change and no tenderness. Left breast exhibits no inverted nipple, no mass, no nipple discharge, no skin change and no tenderness. No breast swelling, tenderness, discharge or bleeding. Breasts are symmetrical.  Abdominal: Soft. Bowel sounds are normal. She exhibits no distension and no mass. There is no tenderness. There is no rebound and no guarding.  Musculoskeletal: Normal range of motion. She exhibits no edema or tenderness.  Lymphadenopathy:    She has no cervical adenopathy.  Neurological: She is alert and oriented to person, place, and time.  Skin: Skin is warm  and dry. No rash noted. She is not diaphoretic.  Psychiatric: She has a normal mood and affect. Her behavior is normal. Judgment and thought content normal.  Vitals reviewed.    Depression Screen PHQ 2/9 Scores 06/27/2018 05/04/2016  PHQ - 2 Score 2 1  PHQ- 9 Score 7 -      Assessment & Plan:     Routine Health Maintenance and Physical Exam  Exercise Activities and Dietary recommendations Goals    None      Immunization History  Administered Date(s) Administered  . Influenza Split 10/01/2013  . Influenza,inj,Quad PF,6+ Mos 12/28/2015  . Influenza-Unspecified 10/17/2016, 10/03/2017    Health Maintenance  Topic Date Due  . HIV Screening  05/28/1993  . TETANUS/TDAP  05/28/1997  . PAP SMEAR  05/29/1999  . INFLUENZA VACCINE  07/10/2018     Discussed health benefits of physical activity, and encouraged her to engage in regular exercise appropriate for her age and condition.    1. Annual physical exam Normal physical exam today. Will check labs as below and f/u pending lab results. If labs are stable and WNL she will not need to have these rechecked for one year at her next annual physical exam. She is to call the office in the meantime if she has any acute issue, questions or concerns. - CBC with  Differential/Platelet - Comprehensive metabolic panel  2. Prediabetes Has PCOS and obesity. Will check labs as below and f/u pending results. Diagnosis pulled for medication refill. Continue current medical treatment plan. - Hemoglobin A1c - metFORMIN (GLUCOPHAGE-XR) 500 MG 24 hr tablet; Take 1 tablet (500 mg total) by mouth daily with breakfast.  Dispense: 90 tablet; Refill: 1  3. Borderline high serum cholesterol Will check labs as below and f/u pending results. - Lipid panel  4. Screening for thyroid disorder Will check labs as below and f/u pending results. - TSH  5. Family history of breast cancer Breast exam today was normal. There is family history of breast cancer in her mother. She does perform regular self breast exams. Mammogram was ordered as below. Information for [facility] was given to patient so she may schedule her mammogram at her convenience. - MM DIGITAL SCREENING BILATERAL; Future  6. PCOS (polycystic ovarian syndrome) Stable. Will restart metformin daily for regulation and insulin resistnace since patient's previous A1c was prediabetic range.  - metFORMIN (GLUCOPHAGE-XR) 500 MG 24 hr tablet; Take 1 tablet (500 mg total) by mouth daily with breakfast.  Dispense: 90 tablet; Refill: 1  7. Avitaminosis D H/O this. Will check labs as below and f/u pending results. - Vitamin D (25 hydroxy)  8. Mild episode of recurrent major depressive disorder (HCC) Not to goal. Will increase lexapro to 20mg  from 15mg . Call if no improvements over the next 6-12 weeks.  - escitalopram (LEXAPRO) 20 MG tablet; Take 1 tablet (20 mg total) by mouth at bedtime.  Dispense: 90 tablet; Refill: 1  9. Need for Tdap vaccination Tdap Vaccine given to patient without complications. Patient sat for 15 minutes after administration and was tolerated well without adverse effects. - Tdap vaccine greater than or equal to 7yo  IM  --------------------------------------------------------------------    Margaretann Loveless, PA-C  [facility]

## 2018-06-27 NOTE — Patient Instructions (Signed)

## 2018-06-28 LAB — LIPID PANEL
CHOL/HDL RATIO: 3.9 ratio (ref 0.0–4.4)
Cholesterol, Total: 183 mg/dL (ref 100–199)
HDL: 47 mg/dL (ref >39)
LDL Calculated: 117 mg/dL — ABNORMAL HIGH (ref 0–99)
TRIGLYCERIDES: 95 mg/dL (ref 0–149)
VLDL Cholesterol Cal: 19 mg/dL (ref 5–40)

## 2018-06-28 LAB — COMPREHENSIVE METABOLIC PANEL
ALBUMIN: 4 g/dL (ref 3.5–5.5)
ALK PHOS: 68 IU/L (ref 39–117)
ALT: 27 IU/L (ref 0–32)
AST: 22 IU/L (ref 0–40)
Albumin/Globulin Ratio: 1.3 (ref 1.2–2.2)
BILIRUBIN TOTAL: 0.6 mg/dL (ref 0.0–1.2)
BUN / CREAT RATIO: 10 (ref 9–23)
BUN: 9 mg/dL (ref 6–24)
CHLORIDE: 100 mmol/L (ref 96–106)
CO2: 26 mmol/L (ref 20–29)
Calcium: 9.3 mg/dL (ref 8.7–10.2)
Creatinine, Ser: 0.89 mg/dL (ref 0.57–1.00)
GFR calc Af Amer: 94 mL/min/{1.73_m2} (ref >59)
GFR calc non Af Amer: 81 mL/min/{1.73_m2} (ref >59)
GLUCOSE: 88 mg/dL (ref 65–99)
Globulin, Total: 3 g/dL (ref 1.5–4.5)
Potassium: 4.5 mmol/L (ref 3.5–5.2)
Sodium: 138 mmol/L (ref 134–144)
Total Protein: 7 g/dL (ref 6.0–8.5)

## 2018-06-28 LAB — CBC WITH DIFFERENTIAL/PLATELET
BASOS ABS: 0 10*3/uL (ref 0.0–0.2)
Basos: 0 %
EOS (ABSOLUTE): 0.1 10*3/uL (ref 0.0–0.4)
Eos: 1 %
Hematocrit: 37.7 % (ref 34.0–46.6)
Hemoglobin: 12.9 g/dL (ref 11.1–15.9)
Immature Grans (Abs): 0 10*3/uL (ref 0.0–0.1)
Immature Granulocytes: 0 %
LYMPHS ABS: 1.2 10*3/uL (ref 0.7–3.1)
Lymphs: 22 %
MCH: 26.3 pg — ABNORMAL LOW (ref 26.6–33.0)
MCHC: 34.2 g/dL (ref 31.5–35.7)
MCV: 77 fL — ABNORMAL LOW (ref 79–97)
MONOS ABS: 0.5 10*3/uL (ref 0.1–0.9)
Monocytes: 9 %
NEUTROS PCT: 68 %
Neutrophils Absolute: 3.8 10*3/uL (ref 1.4–7.0)
PLATELETS: 293 10*3/uL (ref 150–450)
RBC: 4.9 x10E6/uL (ref 3.77–5.28)
RDW: 15 % (ref 12.3–15.4)
WBC: 5.6 10*3/uL (ref 3.4–10.8)

## 2018-06-28 LAB — HEMOGLOBIN A1C
Est. average glucose Bld gHb Est-mCnc: 120 mg/dL
HEMOGLOBIN A1C: 5.8 % — AB (ref 4.8–5.6)

## 2018-06-28 LAB — TSH: TSH: 1.58 u[IU]/mL (ref 0.450–4.500)

## 2018-06-28 LAB — VITAMIN D 25 HYDROXY (VIT D DEFICIENCY, FRACTURES): VIT D 25 HYDROXY: 16.6 ng/mL — AB (ref 30.0–100.0)

## 2018-06-30 ENCOUNTER — Telehealth: Payer: Self-pay

## 2018-06-30 MED ORDER — VITAMIN D (ERGOCALCIFEROL) 1.25 MG (50000 UNIT) PO CAPS: 50000.0000 [IU] | ORAL_CAPSULE | ORAL | 1 refills | Status: DC

## 2018-06-30 NOTE — Telephone Encounter (Signed)
-----   Message from Margaretann LovelessJennifer M Burnette, PA-C sent at 06/30/2018  8:43 AM EDT ----- Cholesterol improved since last year. A1c also down to 5.8 from 6.3. Kidney and liver function is normal. Blood count is normal. Thyroid is normal. Vit D is low at 16. Will send in high dose Vit D supplement for you.

## 2018-06-30 NOTE — Addendum Note (Signed)
Addended by: Margaretann LovelessBURNETTE, Stonewall Doss M on: 06/30/2018 08:44 AM   Modules accepted: Orders

## 2018-06-30 NOTE — Telephone Encounter (Signed)
Patient advised as directed below.  Thanks,  -Iracema Lanagan 

## 2018-07-14 ENCOUNTER — Telehealth: Payer: Self-pay

## 2018-07-14 NOTE — Telephone Encounter (Signed)
LMTCB

## 2018-07-14 NOTE — Telephone Encounter (Signed)
Patient called saying that she was seen in the office on 7/19 and Lexapro was increase to 20mg . She reports that she is having side effects from the medication. She reports that she has night sweats, nausea, and can not sleep. She is wanting to know will symptoms subside, or should she stop taking the medication? Please advise. Contact number is correct. Thanks!

## 2018-07-14 NOTE — Telephone Encounter (Signed)
Side effects should have subsided. They normally only last one to two weeks. If she wishes to change therapy she should come in for an appt to discuss options and tapering.

## 2018-07-14 NOTE — Telephone Encounter (Signed)
Please Review

## 2018-07-15 ENCOUNTER — Ambulatory Visit: Payer: BC Managed Care – PPO | Admitting: Physician Assistant

## 2018-07-15 ENCOUNTER — Encounter: Payer: Self-pay | Admitting: Physician Assistant

## 2018-07-15 VITALS — BP 120/80 | HR 64 | Temp 98.5°F | Resp 16 | Wt 336.5 lb

## 2018-07-15 DIAGNOSIS — F411 Generalized anxiety disorder: Secondary | ICD-10-CM | POA: Diagnosis not present

## 2018-07-15 DIAGNOSIS — F330 Major depressive disorder, recurrent, mild: Secondary | ICD-10-CM | POA: Diagnosis not present

## 2018-07-15 MED ORDER — ALPRAZOLAM 0.5 MG PO TABS: 0.5000 mg | ORAL_TABLET | Freq: Two times a day (BID) | ORAL | 0 refills | Status: DC | PRN

## 2018-07-15 MED ORDER — SERTRALINE HCL 50 MG PO TABS
50.0000 mg | ORAL_TABLET | Freq: Every day | ORAL | 3 refills | Status: DC
Start: 2018-07-15 — End: 2018-07-22

## 2018-07-15 NOTE — Progress Notes (Signed)
Patient: Alexis Miles Female    DOB: 1976/08/19   42 y.o.   MRN: [id number] Visit Date: 07/15/2018  Today's Provider: Margaretann Loveless, PA-C   Chief Complaint  Patient presents with  . Medication Problem   Subjective:    HPI Patient here today to discuss changing therapy from Lexapro to something else. Her Lexapro was increased to 20mg  on 07/19 and patient started to have side effect from the medication. Reports night sweats, nausea and sleep disturbances.She reports that she still taking 10mg  of the Lexapro. She reports she is only sleeping 2-4 hours.She is having side effect: poor appetite, nausea and insomnia.  Wt Readings from Last 3 Encounters:  07/15/18 (!) 336 lb 8 oz (152.6 kg)  06/27/18 (!) 350 lb (158.8 kg)  12/06/17 (!) 357 lb (161.9 kg)      Allergies  Allergen Reactions  . Penicillins   . Trazodone And Nefazodone Other (See Comments)    Suicidal Ideation     Current Outpatient Medications:  .  acetaminophen (TYLENOL) 500 MG tablet, Take 500 mg by mouth every 6 (six) hours as needed., Disp: , Rfl:  .  escitalopram (LEXAPRO) 20 MG tablet, Take 1 tablet (20 mg total) by mouth at bedtime., Disp: 90 tablet, Rfl: 1 .  Vitamin D, Ergocalciferol, (DRISDOL) 50000 units CAPS capsule, Take 1 capsule (50,000 Units total) by mouth every 7 (seven) days., Disp: 12 capsule, Rfl: 1 .  metFORMIN (GLUCOPHAGE-XR) 500 MG 24 hr tablet, Take 1 tablet (500 mg total) by mouth daily with breakfast. (Patient not taking: Reported on 07/15/2018), Disp: 90 tablet, Rfl: 1  Review of Systems  Constitutional: Positive for appetite change and fatigue.  Respiratory: Negative.   Cardiovascular: Negative.   Gastrointestinal: Positive for nausea.  Neurological: Negative.   Psychiatric/Behavioral: Positive for dysphoric mood and sleep disturbance. The patient is nervous/anxious.     Social History   Tobacco Use  . Smoking status: Never Smoker  . Smokeless tobacco: Never Used    Substance Use Topics  . Alcohol use: Yes    Comment: Rare   Objective:   BP 120/80 (BP Location: Left Wrist, Patient Position: Sitting, Cuff Size: Normal)   Pulse 64   Temp 98.5 F (36.9 C) (Oral)   Resp 16   Wt (!) 336 lb 8 oz (152.6 kg)   SpO2 97%   BMI 54.31 kg/m  Vitals:   07/15/18 1613  BP: 120/80  Pulse: 64  Resp: 16  Temp: 98.5 F (36.9 C)  TempSrc: Oral  SpO2: 97%  Weight: (!) 336 lb 8 oz (152.6 kg)     Physical Exam  Constitutional: She appears well-developed and well-nourished. No distress.  Neck: Normal range of motion. Neck supple.  Cardiovascular: Normal rate, regular rhythm and normal heart sounds. Exam reveals no gallop and no friction rub.  No murmur heard. Pulmonary/Chest: Effort normal and breath sounds normal. No respiratory distress. She has no wheezes. She has no rales.  Skin: She is not diaphoretic.  Psychiatric: Her speech is normal and behavior is normal. Judgment and thought content normal. Her mood appears anxious. Cognition and memory are normal. She exhibits a depressed mood. She expresses no homicidal and no suicidal ideation.  Vitals reviewed.       Assessment & Plan:     1. GAD (generalized anxiety disorder) Change lexapro to sertraline as below. Add alprazolam BID prn. I will see her back in 4-6 weeks. She is to call if she  has adverse reactions to the medication or notice it not working well.  - sertraline (ZOLOFT) 50 MG tablet; Take 1 tablet (50 mg total) by mouth daily.  Dispense: 30 tablet; Refill: 3 - ALPRAZolam (XANAX) 0.5 MG tablet; Take 1 tablet (0.5 mg total) by mouth 2 (two) times daily as needed for anxiety.  Dispense: 60 tablet; Refill: 0  2. Mild episode of recurrent major depressive disorder (HCC) See above medical treatment plan. - sertraline (ZOLOFT) 50 MG tablet; Take 1 tablet (50 mg total) by mouth daily.  Dispense: 30 tablet; Refill: 3 - ALPRAZolam (XANAX) 0.5 MG tablet; Take 1 tablet (0.5 mg total) by mouth 2  (two) times daily as needed for anxiety.  Dispense: 60 tablet; Refill: 0       Margaretann LovelessJennifer M Jabez Molner, PA-C  [facility]

## 2018-07-15 NOTE — Telephone Encounter (Signed)
Patient came to an appointment today to discuss change in therapy from the Lexapro to something else.

## 2018-07-15 NOTE — Patient Instructions (Signed)

## 2018-07-22 ENCOUNTER — Emergency Department
Admission: EM | Admit: 2018-07-22 | Discharge: 2018-07-22 | Disposition: A | Discharge status: Home / Self Care | Payer: BC Managed Care – PPO | Attending: Emergency Medicine | Admitting: Emergency Medicine

## 2018-07-22 ENCOUNTER — Encounter: Payer: Self-pay | Admitting: Emergency Medicine

## 2018-07-22 ENCOUNTER — Other Ambulatory Visit: Payer: Self-pay

## 2018-07-22 DIAGNOSIS — F339 Major depressive disorder, recurrent, unspecified: Secondary | ICD-10-CM | POA: Diagnosis present

## 2018-07-22 DIAGNOSIS — T424X2A Poisoning by benzodiazepines, intentional self-harm, initial encounter: Secondary | ICD-10-CM | POA: Diagnosis present

## 2018-07-22 DIAGNOSIS — Z79899 Other long term (current) drug therapy: Secondary | ICD-10-CM | POA: Insufficient documentation

## 2018-07-22 DIAGNOSIS — T50902A Poisoning by unspecified drugs, medicaments and biological substances, intentional self-harm, initial encounter: Secondary | ICD-10-CM

## 2018-07-22 LAB — COMPREHENSIVE METABOLIC PANEL
ALK PHOS: 53 U/L (ref 38–126)
ALT: 39 U/L (ref 0–44)
ANION GAP: 8 (ref 5–15)
AST: 30 U/L (ref 15–41)
Albumin: 3.9 g/dL (ref 3.5–5.0)
BUN: 11 mg/dL (ref 6–20)
CALCIUM: 9.1 mg/dL (ref 8.9–10.3)
CO2: 28 mmol/L (ref 22–32)
Chloride: 104 mmol/L (ref 98–111)
Creatinine, Ser: 0.93 mg/dL (ref 0.44–1.00)
GFR calc Af Amer: >60 mL/min (ref >60)
Glucose, Bld: 133 mg/dL — ABNORMAL HIGH (ref 70–99)
Potassium: 3.2 mmol/L — ABNORMAL LOW (ref 3.5–5.1)
Sodium: 140 mmol/L (ref 135–145)
TOTAL PROTEIN: 7.3 g/dL (ref 6.5–8.1)
Total Bilirubin: 1 mg/dL (ref 0.3–1.2)

## 2018-07-22 LAB — CBC WITH DIFFERENTIAL/PLATELET
BASOS PCT: 1 %
Basophils Absolute: 0 10*3/uL (ref 0–0.1)
EOS ABS: 0 10*3/uL (ref 0–0.7)
EOS PCT: 1 %
HCT: 39 % (ref 35.0–47.0)
Hemoglobin: 13.4 g/dL (ref 12.0–16.0)
Lymphocytes Relative: 19 %
Lymphs Abs: 1.2 10*3/uL (ref 1.0–3.6)
MCH: 27.3 pg (ref 26.0–34.0)
MCHC: 34.3 g/dL (ref 32.0–36.0)
MCV: 79.4 fL — ABNORMAL LOW (ref 80.0–100.0)
MONOS PCT: 6 %
Monocytes Absolute: 0.4 10*3/uL (ref 0.2–0.9)
NEUTROS PCT: 73 %
Neutro Abs: 4.6 10*3/uL (ref 1.4–6.5)
PLATELETS: 229 10*3/uL (ref 150–440)
RBC: 4.92 MIL/uL (ref 3.80–5.20)
RDW: 15.5 % — AB (ref 11.5–14.5)
WBC: 6.3 10*3/uL (ref 3.6–11.0)

## 2018-07-22 LAB — ETHANOL

## 2018-07-22 LAB — SALICYLATE LEVEL: Salicylate Lvl: <7 mg/dL (ref 2.8–30.0)

## 2018-07-22 MED ORDER — BUSPIRONE HCL 5 MG PO TABS
10.0000 mg | ORAL_TABLET | Freq: Two times a day (BID) | ORAL | Status: DC
  Administered 2018-07-22: 10 mg via ORAL

## 2018-07-22 MED ORDER — SERTRALINE HCL 50 MG PO TABS
ORAL_TABLET | ORAL | Status: AC
  Filled 2018-07-22: qty 2

## 2018-07-22 MED ORDER — SERTRALINE HCL 100 MG PO TABS: 100.0000 mg | ORAL_TABLET | Freq: Every day | ORAL | 1 refills | Status: DC

## 2018-07-22 MED ORDER — BUSPIRONE HCL 10 MG PO TABS: 10.0000 mg | ORAL_TABLET | Freq: Two times a day (BID) | ORAL | 1 refills | Status: DC

## 2018-07-22 MED ORDER — BUSPIRONE HCL 5 MG PO TABS
ORAL_TABLET | ORAL | Status: AC
  Filled 2018-07-22: qty 2

## 2018-07-22 MED ORDER — SERTRALINE HCL 50 MG PO TABS
100.0000 mg | ORAL_TABLET | Freq: Every day | ORAL | Status: DC
  Administered 2018-07-22: 100 mg via ORAL

## 2018-07-22 NOTE — ED Notes (Signed)
MD and RN at bedside

## 2018-07-22 NOTE — ED Notes (Signed)
First Nurse Note: Patient ambulatory to STAT desk and stated she took 6 Xanax 20 min. Ago intentionally.  STAT registered and taken to Triage 1.

## 2018-07-22 NOTE — ED Notes (Signed)
Pt is in NAD. Pt is more alert and able to remain awake and talk to family without difficulty. Pt requesting food and drink at this time and updated to the best of this RNs ability.

## 2018-07-22 NOTE — Consult Note (Signed)
Alexis Miles   Reason for Miles:  overdose Referring Physician:  Dr. Cinda Quest Patient Identification: Alexis Miles MRN:  [id number] Principal Diagnosis: Major depressive disorder, recurrent episode with anxious distress [facility]) Diagnosis:   Patient Active Problem List   Diagnosis Date Noted  . Major depressive disorder, recurrent episode with anxious distress ([place]) [F33.9] 07/22/2018    Priority: High  . Depression [F32.9] 06/27/2018  . Morbid obesity ([place]) [E66.01] 05/04/2016  . Anxiety [F41.9] 12/28/2015  . Borderline high serum cholesterol [E78.9] 12/28/2015  . Accumulation of fluid in tissues [R60.9] 12/28/2015  . Fatigue [R53.83] 12/28/2015  . Blood glucose elevated [R73.9] 12/28/2015  . Migraine [G43.909] 12/28/2015  . PCOS (polycystic ovarian syndrome) [E28.2] 12/28/2015  . Avitaminosis D [E55.9] 12/28/2015  . Insomnia [G47.00] 12/28/2015    Total Time spent with patient: 1 hour   Identifying data. Alexis Miles is a 42 year old female with a history of recurring depression and anxiety.  Chief complaint. "It was impulsive."  History of present illness, Information was obtained from the patient and the chart. The patient came to the ER with her father after she took 6 pills of 0.5 mg Xanax this morning. She has been struggling with depression and severe anxiety and momentarily felt overwhelmed with stress at work and took Xanax "to rest". She immediately call her father and tried to induce vomiting. She reports longe history of depression staring 15 years ago. In the past months her symptoms were worsening, especially anxiety, and her medications were adjusted several times with little improvement. She took Prozac, then Lexapro 20 mg, and most recently Zoloft 50 mg. This was accompanied by Xanax 0.5 mg BID PRN. In recent weeks, she started experiencing poor sleep, decreased appetite, anhedonia, feeling of guilt hopelessness worthlessness, poor energy and  concentration, social isolation and crying spells. Her anxiety has worsened and she had several panic attacks. She denies suicidal thinking and her overdose was impulsive. She did experienced suicidal ideation in the past, after her niece or mother passed away but never acted on them. She denies other forms of anxiety, no OCD or PTSD. There are  o psychoitic symptoms or symptoms suggestive of bipolar mania. She does not use drugs or alcohol. Denies misuse of Xanax. In fact up until recently, she took it very infrequently. Now she uses it as a sleeping pill.   Past psychiatric history. There were no hospitalizations or suicide attempts. She was briefly in therapy while in college.  Family psychiatric history. Her mother and sisters suffer depression. Likely father as well.  Social history. Works as Biomedical scientist.   Risk to Self: Suicidal Ideation: No-Not Currently/Within Last 6 Months Suicidal Intent: No-Not Currently/Within Last 6 Months Is patient at risk for suicide?: Yes Suicidal Plan?: No-Not Currently/Within Last 6 Months Access to Means: Yes Specify Access to Suicidal Means: Medications What has been your use of drugs/alcohol within the last 12 months?: Reports of none How many times?: 1 Other Self Harm Risks: Reports of none Triggers for Past Attempts: None known Intentional Self Injurious Behavior: None Risk to Others: Homicidal Ideation: No Thoughts of Harm to Others: No Current Homicidal Intent: No Current Homicidal Plan: No Access to Homicidal Means: No Identified Victim: Reports of none History of harm to others?: No Assessment of Violence: None Noted Violent Behavior Description: Reports of none Does patient have access to weapons?: No Criminal Charges Pending?: No Does patient have a court date: No Prior Inpatient Therapy: Prior Inpatient Therapy: No Prior Outpatient Therapy: Prior  Outpatient Therapy: No Does patient have an ACCT team?: No Does patient have  Intensive In-House Services?  : No Does patient have [facility] services? : No Does patient have P4CC services?: No  Past Medical History:  Past Medical History:  Diagnosis Date  . Anxiety     Past Surgical History:  Procedure Laterality Date  . BARTHOLIN GLAND CYST EXCISION    . MOUTH SURGERY     wisdom teeth removed   Family History:  Family History  Problem Relation Age of Onset  . Arthritis Mother   . Lymphoma Mother   . Heart disease Father        MI, CABG, CAD  . Diabetes Father   . Hyperlipidemia Sister   . Depression Sister   . Anxiety disorder Sister   . Cancer Maternal Grandmother        colon, breast   . Diabetes Maternal Grandmother   . Alcohol abuse Paternal Grandfather   . Hypertension Sister   . Depression Sister   . Anxiety disorder Sister   . Diabetes Maternal Aunt   . Lymphoma Maternal Aunt     Social History:  Social History   Substance and Sexual Activity  Alcohol Use Yes   Comment: Rare     Social History   Substance and Sexual Activity  Drug Use No    Social History   Socioeconomic History  . Marital status: Single    Spouse name: Not on file  . Number of children: 0  . Years of education: College  . Highest education level: Not on file  Occupational History  . Occupation: Full Time    Employer: [employer]    Comment: Acupuncturist  Social Needs  . Financial resource strain: Not on file  . Food insecurity:    Worry: Not on file    Inability: Not on file  . Transportation needs:    Medical: Not on file    Non-medical: Not on file  Tobacco Use  . Smoking status: Never Smoker  . Smokeless tobacco: Never Used  Substance and Sexual Activity  . Alcohol use: Yes    Comment: Rare  . Drug use: No  . Sexual activity: Never    Comment: Never sexually active  Lifestyle  . Physical activity:    Days per week: Not on file    Minutes per session: Not on file  . Stress: Not on file  Relationships  . Social  connections:    Talks on phone: Not on file    Gets together: Not on file    Attends religious service: Not on file    Active member of club or organization: Not on file    Attends meetings of clubs or organizations: Not on file    Relationship status: Not on file  Other Topics Concern  . Not on file  Social History Narrative  . Not on file   Additional Social History:    Allergies:   Allergies  Allergen Reactions  . Penicillins Other (See Comments)    Has patient had a PCN reaction causing immediate rash, facial/tongue/throat swelling, SOB or lightheadedness with hypotension: Unknown Has patient had a PCN reaction causing severe rash involving mucus membranes or skin necrosis: Unknown Has patient had a PCN reaction that required hospitalization: Unknown Has patient had a PCN reaction occurring within the last 10 years: Unknown If all of the above answers are "NO", then may proceed with Cephalosporin use.   . Trazodone And  Nefazodone Other (See Comments)    Suicidal Ideation    Labs:  Results for orders placed or performed during the hospital encounter of 07/22/18 (from the past 48 hour(s))  Comprehensive metabolic panel     Status: Abnormal   Collection Time: 07/22/18  9:09 AM  Result Value Ref Range   Sodium 140 135 - 145 mmol/L   Potassium 3.2 (L) 3.5 - 5.1 mmol/L   Chloride 104 98 - 111 mmol/L   CO2 28 22 - 32 mmol/L   Glucose, Bld 133 (H) 70 - 99 mg/dL   BUN 11 6 - 20 mg/dL   Creatinine, Ser 0.93 0.44 - 1.00 mg/dL   Calcium 9.1 8.9 - 10.3 mg/dL   Total Protein 7.3 6.5 - 8.1 g/dL   Albumin 3.9 3.5 - 5.0 g/dL   AST 30 15 - 41 U/L   ALT 39 0 - 44 U/L   Alkaline Phosphatase 53 38 - 126 U/L   Total Bilirubin 1.0 0.3 - 1.2 mg/dL   GFR calc non Af Amer >60 >60 mL/min   GFR calc Af Amer >60 >60 mL/min    Comment: (NOTE) The eGFR has been calculated using the CKD EPI equation. This calculation has not been validated in all clinical situations. eGFR's persistently <60  mL/min signify possible Chronic Kidney Disease.    Anion gap 8 5 - 15    Comment: Performed at [facility], [address] [place], [place] [address]  Ethanol     Status: None   Collection Time: 07/22/18  9:09 AM  Result Value Ref Range   Alcohol, Ethyl (B) <10 <10 mg/dL    Comment: (NOTE) Lowest detectable limit for serum alcohol is 10 mg/dL. For medical purposes only. Performed at [facility], [address] [place], [place] [address]   Salicylate level     Status: None   Collection Time: 07/22/18  9:09 AM  Result Value Ref Range   Salicylate Lvl <3.3 2.8 - 30.0 mg/dL    Comment: Performed at [facility], [address] [place], [place] [address]  CBC with Differential     Status: Abnormal   Collection Time: 07/22/18  9:09 AM  Result Value Ref Range   WBC 6.3 3.6 - 11.0 K/uL   RBC 4.92 3.80 - 5.20 MIL/uL   Hemoglobin 13.4 12.0 - 16.0 g/dL   HCT 39.0 35.0 - 47.0 %   MCV 79.4 (L) 80.0 - 100.0 fL   MCH 27.3 26.0 - 34.0 pg   MCHC 34.3 32.0 - 36.0 g/dL   RDW 15.5 (H) 11.5 - 14.5 %   Platelets 229 150 - 440 K/uL   Neutrophils Relative % 73 %   Neutro Abs 4.6 1.4 - 6.5 K/uL   Lymphocytes Relative 19 %   Lymphs Abs 1.2 1.0 - 3.6 K/uL   Monocytes Relative 6 %   Monocytes Absolute 0.4 0.2 - 0.9 K/uL   Eosinophils Relative 1 %   Eosinophils Absolute 0.0 0 - 0.7 K/uL   Basophils Relative 1 %   Basophils Absolute 0.0 0 - 0.1 K/uL    Comment: Performed at [facility], [address] [place], [place] [address]    No current facility-administered medications for this encounter.    Current Outpatient Medications  Medication Sig Dispense Refill  . acetaminophen (TYLENOL) 500 MG tablet Take 500 mg by mouth every 6 (six) hours as needed for mild pain or headache.     . ALPRAZolam (XANAX) 0.5  MG tablet Take 1 tablet (0.5 mg total) by mouth 2 (two) times daily as needed for anxiety. 60 tablet 0  . metFORMIN (GLUCOPHAGE-XR) 500 MG 24 hr  tablet Take 1 tablet (500 mg total) by mouth daily with breakfast. 90 tablet 1  . sertraline (ZOLOFT) 50 MG tablet Take 1 tablet (50 mg total) by mouth daily. 30 tablet 3  . Vitamin D, Ergocalciferol, (DRISDOL) 50000 units CAPS capsule Take 1 capsule (50,000 Units total) by mouth every 7 (seven) days. 12 capsule 1    Musculoskeletal: Strength & Muscle Tone: within normal limits Gait & Station: normal Patient leans: N/A  Psychiatric Specialty Exam: Physical Exam  Nursing note and vitals reviewed. Psychiatric: She has a normal mood and affect. Her speech is normal. Thought content normal. Cognition and memory are normal. She expresses impulsivity.    Review of Systems  Neurological: Negative.   Psychiatric/Behavioral: Positive for depression. The patient is nervous/anxious and has insomnia.   All other systems reviewed and are negative.   Blood pressure 121/67, pulse 60, temperature 98.5 F (36.9 C), temperature source Oral, resp. rate (!) 22, height '5\' 5"'  (1.651 m), weight (!) 149.7 kg, last menstrual period 06/16/2018, SpO2 98 %.Body mass index is 54.91 kg/m.  General Appearance: Casual  Eye Contact:  Good  Speech:  Clear and Coherent  Volume:  Normal  Mood:  Anxious and Euthymic  Affect:  Appropriate  Thought Process:  Goal Directed and Descriptions of Associations: Intact  Orientation:  Full (Time, Place, and Person)  Thought Content:  WDL  Suicidal Thoughts:  No  Homicidal Thoughts:  No  Memory:  Immediate;   Fair Recent;   Fair Remote;   Fair  Judgement:  Impaired  Insight:  Present  Psychomotor Activity:  Normal  Concentration:  Concentration: Fair and Attention Span: Fair  Recall:  [employer] of Knowledge:  Fair  Language:  Fair  Akathisia:  No  Handed:  Right  AIMS (if indicated):     Assets:  Communication Skills Desire for Improvement Financial Resources/Insurance [place] Talents/Skills Transportation Vocational/Educational  ADL's:  Intact  Cognition:  WNL  Sleep:        Treatment Plan Summary: Daily contact with patient to assess and evaluate symptoms and progress in treatment and Medication management   PLAN: 1. Patient does not meet criteria for IVC. Please discharge as appropriate. 2. I will increase Zoloft to 100 mg daily and add BuSpar 5 mg TID. 3. I suggest Melatoniun for sleep. 4. I referred this patient for a follow up with [facility]. They will call with appointment.    Disposition: No evidence of imminent risk to self or others at present.   Patient does not meet criteria for psychiatric inpatient admission. Supportive therapy provided about ongoing stressors. Discussed crisis plan, support from social network, calling 911, coming to the Emergency Department, and calling Suicide Hotline.  Orson Slick, MD 07/22/2018 1:10 PM

## 2018-07-22 NOTE — ED Provider Notes (Signed)
[facility] Emergency Department Provider Note   ____________________________________________   First MD Initiated Contact with Patient 07/22/18 [phone]     (approximate)  I have reviewed the triage vital signs and the nursing notes.   HISTORY  Chief Complaint Drug Overdose    HPI Alexis Miles is a 42 y.o. female reports she took 6 0.5 mg Xanax.  Patient states she did this an hour prior to arrival at 730 this morning.  Patient states she regretted it immediately called her dad.  Her dad brought her here.   Past Medical History:  Diagnosis Date  . Anxiety     Patient Active Problem List   Diagnosis Date Noted  . Major depressive disorder, recurrent episode with anxious distress (HCC) 07/22/2018  . Depression 06/27/2018  . Morbid obesity (HCC) 05/04/2016  . Anxiety 12/28/2015  . Borderline high serum cholesterol 12/28/2015  . Accumulation of fluid in tissues 12/28/2015  . Fatigue 12/28/2015  . Blood glucose elevated 12/28/2015  . Migraine 12/28/2015  . PCOS (polycystic ovarian syndrome) 12/28/2015  . Avitaminosis D 12/28/2015  . Insomnia 12/28/2015    Past Surgical History:  Procedure Laterality Date  . BARTHOLIN GLAND CYST EXCISION    . MOUTH SURGERY     wisdom teeth removed    Prior to Admission medications   Medication Sig Start Date End Date Taking? Authorizing Provider  acetaminophen (TYLENOL) 500 MG tablet Take 500 mg by mouth every 6 (six) hours as needed for mild pain or headache.    Yes [provider]  ALPRAZolam (XANAX) 0.5 MG tablet Take 1 tablet (0.5 mg total) by mouth 2 (two) times daily as needed for anxiety. 07/15/18  Yes Joycelyn ManBurnette, Jennifer M, PA-C  metFORMIN (GLUCOPHAGE-XR) 500 MG 24 hr tablet Take 1 tablet (500 mg total) by mouth daily with breakfast. 06/27/18 09/25/18 Yes Burnette, Alessandra BevelsJennifer M, PA-C  Vitamin D, Ergocalciferol, (DRISDOL) 50000 units CAPS capsule Take 1 capsule (50,000 Units total) by mouth every 7  (seven) days. 06/30/18  Yes Margaretann LovelessBurnette, Jennifer M, PA-C  busPIRone (BUSPAR) 10 MG tablet Take 1 tablet (10 mg total) by mouth 2 (two) times daily. 07/22/18   Pucilowska, Braulio ConteJolanta B, MD  sertraline (ZOLOFT) 100 MG tablet Take 1 tablet (100 mg total) by mouth daily. 07/22/18   Pucilowska, Ellin GoodieJolanta B, MD    Allergies Penicillins and Trazodone and nefazodone  Family History  Problem Relation Age of Onset  . Arthritis Mother   . Lymphoma Mother   . Heart disease Father        MI, CABG, CAD  . Diabetes Father   . Hyperlipidemia Sister   . Depression Sister   . Anxiety disorder Sister   . Cancer Maternal Grandmother        colon, breast   . Diabetes Maternal Grandmother   . Alcohol abuse Paternal Grandfather   . Hypertension Sister   . Depression Sister   . Anxiety disorder Sister   . Diabetes Maternal Aunt   . Lymphoma Maternal Aunt     Social History Social History   Tobacco Use  . Smoking status: Never Smoker  . Smokeless tobacco: Never Used  Substance Use Topics  . Alcohol use: Yes    Comment: Rare  . Drug use: No    Review of Systems  Constitutional: No fever/chills Eyes: No visual changes. ENT: No sore throat. Cardiovascular: Denies chest pain. Respiratory: Denies shortness of breath. Gastrointestinal: No abdominal pain.  No nausea, no vomiting.  No diarrhea.  No constipation. Genitourinary: Negative for dysuria. Musculoskeletal: Negative for back pain. Skin: Negative for rash. Neurological: Negative for headaches, focal weakness   ____________________________________________   PHYSICAL EXAM:  VITAL SIGNS: ED Triage Vitals  Enc Vitals Group     BP 07/22/18 0808 124/68     Pulse Rate 07/22/18 0808 75     Resp 07/22/18 0808 18     Temp 07/22/18 0808 98.5 F (36.9 C)     Temp Source 07/22/18 0808 Oral     SpO2 07/22/18 0808 97 %     Weight 07/22/18 0805 (!) 330 lb (149.7 kg)     Height 07/22/18 0805 5\' 5"  (1.651 m)     Head Circumference --      Peak Flow  --      Pain Score 07/22/18 0805 0     Pain Loc --      Pain Edu? --      Excl. in GC? --     Constitutional: Alert and oriented.  sHe is a little sleepy Eyes: Conjunctivae are normal. PERRL. EOMI. Head: Atraumatic. Nose: No congestion/rhinnorhea. Mouth/Throat: Mucous membranes are moist.  Oropharynx non-erythematous. Neck: No stridor.   Cardiovascular: Normal rate, regular rhythm. Grossly normal heart sounds.  Good peripheral circulation. Respiratory: Normal respiratory effort.  No retractions. Lungs CTAB. Gastrointestinal: Soft and nontender. No distention. No abdominal bruits. No CVA tenderness. Musculoskeletal: No lower extremity tenderness nor edema.   Neurologic:  Normal speech and language. No gross focal neurologic deficits are appreciated.  Skin:  Skin is warm, dry and intact. No rash noted. Psychiatric: Mood and affect are normal. Speech and behavior are normal.  ____________________________________________   LABS (all labs ordered are listed, but only abnormal results are displayed)  Labs Reviewed  COMPREHENSIVE METABOLIC PANEL - Abnormal; Notable for the following components:      Result Value   Potassium 3.2 (*)    Glucose, Bld 133 (*)    All other components within normal limits  CBC WITH DIFFERENTIAL/PLATELET - Abnormal; Notable for the following components:   MCV 79.4 (*)    RDW 15.5 (*)    All other components within normal limits  ETHANOL  SALICYLATE LEVEL  PREGNANCY, URINE  URINALYSIS, COMPLETE (UACMP) WITH MICROSCOPIC  URINE DRUG SCREEN, QUALITATIVE ([facility] ONLY)   ____________________________________________  EKG   ____________________________________________  RADIOLOGY  ED MD interpretation:    Official radiology report(s): No results found.  ____________________________________________   PROCEDURES  Procedure(s) performed:   Procedures  Critical Care performed:  ____________________________________________   INITIAL IMPRESSION  / ASSESSMENT AND PLAN / ED COURSE Dr. Jennet MaduroPucilowska psychiatry sees the patient she will discharge patient home with [facility] follow-up.  Patient is no longer suicidal she understands that she could have died she did immediately ask for help I agree with Dr. Willia CrazePucilowska's decision.         ____________________________________________   FINAL CLINICAL IMPRESSION(S) / ED DIAGNOSES  Final diagnoses:  Intentional drug overdose, initial encounter [facility](HCC)     ED Discharge Orders         Ordered    busPIRone (BUSPAR) 10 MG tablet  2 times daily     07/22/18 1334    sertraline (ZOLOFT) 100 MG tablet  Daily     07/22/18 1334           Note:  This document was prepared using Dragon voice recognition software and may include unintentional dictation errors.    Arnaldo NatalMalinda, Corlette Ciano F, MD 07/22/18 1356

## 2018-07-22 NOTE — ED Notes (Signed)
States she tried to induce vomiting this am after taking the pills but couldn't, has not eaten in a few days.

## 2018-07-22 NOTE — ED Notes (Signed)
PT aware urine sample is needed. Call bell in reach and pt instructed to alert RN when she is able to urinate.

## 2018-07-22 NOTE — Discharge Instructions (Signed)
Please return for any further problems. [facility] should call you to arrange follow-up.  If they do not or if you need anything else again please return here.

## 2018-07-22 NOTE — ED Triage Notes (Signed)
Pt states she took 6 xanax this am around 0745 and immediately regretted the decision as soon as they went down, called her dad to bring her here right away, states she feels "tired" right now. Has been under a lot of stress at home and at work, has no SI at this time.

## 2018-07-22 NOTE — BH Assessment (Signed)
Assessment Note  Alexis Miles is an 42 y.o. female who presents to the ER via her father, after she took an intentional overdose of her medications. Patient states, on today (07/22/2018) she took six of her Xanax pills with the intent of ending her life, however as soon as she took them, she made herself "throw them back up." She further reports, she do not want to die but live a long life but due to current stressors she "wasn't thinking." Patient reports her current job is her main stressor. 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 Now that the school year is about to start she have several deadlines she have to meet but due to the ongoing changes she unable to meet them.  "I come to work and found out something changed again and then I have to redo everything I've already done." Patient reports she has always dealt with  depression because it runs in her family, but anxiety is new for her. Her PCP prescribes her current medications and made a recent change. Patient was prescribed Prozac for years but due to the anxiety it was change to Lexapro. For the last three weeks, she states her anxiety hasn't improved and symptoms have increase.   During the interview, the patient was calm, cooperative and pleasant. She was able to provide appropriate answers to the questions. She currently denies SI/HI and AV/H. She denies any involvement with the legal system and with DSS. She reports of having no history of aggression or violence nor the use of any mind-altering substances.  Diagnosis: Depression  Past Medical History:  Past Medical History:  Diagnosis Date  . Anxiety     Past Surgical History:  Procedure Laterality Date  . BARTHOLIN GLAND CYST EXCISION    . MOUTH SURGERY     wisdom teeth removed    Family History:  Family History  Problem Relation Age of Onset  . Arthritis Mother   . Lymphoma Mother   . Heart disease Father        MI, CABG, CAD  . Diabetes Father    . Hyperlipidemia Sister   . Depression Sister   . Anxiety disorder Sister   . Cancer Maternal Grandmother        colon, breast   . Diabetes Maternal Grandmother   . Alcohol abuse Paternal Grandfather   . Hypertension Sister   . Depression Sister   . Anxiety disorder Sister   . Diabetes Maternal Aunt   . Lymphoma Maternal Aunt     Social History:  reports that she has never smoked. She has never used smokeless tobacco. She reports that she drinks alcohol. She reports that she does not use drugs.  Additional Social History:  Alcohol / Drug Use Pain Medications: See PTA Prescriptions: See PTA Over the Counter: See PTA History of alcohol / drug use?: No history of alcohol / drug abuse Longest period of sobriety (when/how long): Reports of none Negative Consequences of Use: (n/a) Withdrawal Symptoms: (n/a)  CIWA: CIWA-Ar BP: 128/85 Pulse Rate: 67 COWS:    Allergies:  Allergies  Allergen Reactions  . Penicillins Other (See Comments)    Has patient had a PCN reaction causing immediate rash, facial/tongue/throat swelling, SOB or lightheadedness with hypotension: Unknown Has patient had a PCN reaction causing severe rash involving mucus membranes or skin necrosis: Unknown Has patient had a PCN reaction that required hospitalization: Unknown Has patient had a PCN reaction occurring within the last 10 years:  Unknown If all of the above answers are "NO", then may proceed with Cephalosporin use.   . Trazodone And Nefazodone Other (See Comments)    Suicidal Ideation    Home Medications:  (Not in a hospital admission)  OB/GYN Status:  Patient's last menstrual period was 06/16/2018.  General Assessment Data [facility] Assessment: In system Is this a Tele or Face-to-Face Assessment?: Face-to-Face Is this an Initial Assessment or a Re-assessment for this encounter?: Initial Assessment Marital status: Single Maiden name: Marlana SalvageWitt Is patient pregnant?:  No Pregnancy Status: No Living Arrangements: Parent(Live in same home with father) Can pt return to current living arrangement?: Yes Admission Status: Voluntary Is patient capable of signing voluntary admission?: Yes Referral Source: Self/Family/Friend Insurance type: Scientist, research (physical sciences)BCBS  Medical Screening Exam [facility] Walk-in ONLY) Medical Exam completed: Yes  Crisis Care Plan Living Arrangements: Parent(Live in same home with father) Legal Guardian: Other:(Self) Name of Psychiatrist: Reports none Name of Therapist: Reports none  Education Status Is patient currently in school?: No Is the patient employed, unemployed or receiving disability?: Employed  Risk to self with the past 6 months Suicidal Ideation: No-Not Currently/Within Last 6 Months Has patient been a risk to self within the past 6 months prior to admission? : Yes Suicidal Intent: No-Not Currently/Within Last 6 Months Has patient had any suicidal intent within the past 6 months prior to admission? : Yes Is patient at risk for suicide?: Yes Suicidal Plan?: No-Not Currently/Within Last 6 Months Has patient had any suicidal plan within the past 6 months prior to admission? : Yes Access to Means: Yes Specify Access to Suicidal Means: Medications What has been your use of drugs/alcohol within the last 12 months?: Reports of none Previous Attempts/Gestures: No How many times?: 1 Other Self Harm Risks: Reports of none Triggers for Past Attempts: None known Intentional Self Injurious Behavior: None Family Suicide History: No Recent stressful life event(s): Conflict (Comment), Other (Comment) Persecutory voices/beliefs?: No Depression: Yes Depression Symptoms: Feeling angry/irritable Substance abuse history and/or treatment for substance abuse?: No Suicide prevention information given to non-admitted patients: Not applicable  Risk to Others within the past 6 months Homicidal Ideation: No Does patient have any lifetime risk of  violence toward others beyond the six months prior to admission? : No Thoughts of Harm to Others: No Current Homicidal Intent: No Current Homicidal Plan: No Access to Homicidal Means: No Identified Victim: Reports of none History of harm to others?: No Assessment of Violence: None Noted Violent Behavior Description: Reports of none Does patient have access to weapons?: No Criminal Charges Pending?: No Does patient have a court date: No Is patient on probation?: No  Psychosis Hallucinations: None noted Delusions: None noted  Mental Status Report Appearance/Hygiene: Unremarkable, In scrubs Eye Contact: Fair Motor Activity: Freedom of movement, Unremarkable Speech: Logical/coherent, Unremarkable Level of Consciousness: Alert Mood: Depressed, Pleasant Affect: Appropriate to circumstance Anxiety Level: Minimal Thought Processes: Coherent, Relevant Judgement: Unimpaired Orientation: Person, Place, Time, Situation, Appropriate for developmental age Obsessive Compulsive Thoughts/Behaviors: Minimal  Cognitive Functioning Concentration: Normal Memory: Recent Intact, Remote Intact Is patient IDD: No Is patient DD?: No Insight: Fair Impulse Control: Fair Appetite: Good Have you had any weight changes? : No Change Sleep: No Change Total Hours of Sleep: 8 Vegetative Symptoms: None  ADLScreening [facility] Assessment Services) Patient's cognitive ability adequate to safely complete daily activities?: Yes Patient able to express need for assistance with ADLs?: Yes Independently performs ADLs?: Yes (appropriate for developmental age)  Prior Inpatient Therapy Prior  Inpatient Therapy: No  Prior Outpatient Therapy Prior Outpatient Therapy: No Does patient have an ACCT team?: No Does patient have Intensive In-House Services?  : No Does patient have [facility] services? : No Does patient have P4CC services?: No  ADL Screening (condition at time of admission) Patient's cognitive ability  adequate to safely complete daily activities?: Yes Is the patient deaf or have difficulty hearing?: No Does the patient have difficulty seeing, even when wearing glasses/contacts?: No Does the patient have difficulty concentrating, remembering, or making decisions?: No Patient able to express need for assistance with ADLs?: Yes Does the patient have difficulty dressing or bathing?: No Independently performs ADLs?: Yes (appropriate for developmental age) Does the patient have difficulty walking or climbing stairs?: No Weakness of Legs: None Weakness of Arms/Hands: None  Home Assistive Devices/Equipment Home Assistive Devices/Equipment: None  Therapy Consults (therapy consults require a physician order) PT Evaluation Needed: No OT Evalulation Needed: No SLP Evaluation Needed: No Abuse/Neglect Assessment (Assessment to be complete while patient is alone) Abuse/Neglect Assessment Can Be Completed: Yes Physical Abuse: Denies Verbal Abuse: Denies Sexual Abuse: Denies Exploitation of patient/patient's resources: Denies Self-Neglect: Denies Values / Beliefs Cultural Requests During Hospitalization: None Spiritual Requests During Hospitalization: None Consults Spiritual Care Consult Needed: No Social Work Consult Needed: No Merchant navy officerAdvance Directives (For Healthcare) Does Patient Have a Medical Advance Directive?: No       Child/Adolescent Assessment Running Away Risk: Denies(Patient is an adult)  Disposition:  Disposition Initial Assessment Completed for this Encounter: Yes  On Site Evaluation by:   Reviewed with Physician:    Lilyan Gilfordalvin J. Maryum Batterson MS, LCAS, LPC, NCC, CCSI Therapeutic Triage Specialist 07/22/2018 11:38 AM

## 2018-08-13 ENCOUNTER — Ambulatory Visit: Payer: BC Managed Care – PPO | Admitting: Psychiatry

## 2018-08-13 ENCOUNTER — Encounter: Payer: Self-pay | Admitting: Psychiatry

## 2018-08-13 ENCOUNTER — Other Ambulatory Visit: Payer: Self-pay

## 2018-08-13 VITALS — BP 144/83 | HR 99 | Temp 97.8°F | Wt 340.2 lb

## 2018-08-13 DIAGNOSIS — F331 Major depressive disorder, recurrent, moderate: Secondary | ICD-10-CM | POA: Diagnosis not present

## 2018-08-13 DIAGNOSIS — F411 Generalized anxiety disorder: Secondary | ICD-10-CM

## 2018-08-13 DIAGNOSIS — F5105 Insomnia due to other mental disorder: Secondary | ICD-10-CM

## 2018-08-13 MED ORDER — SERTRALINE HCL 100 MG PO TABS: 100.0000 mg | ORAL_TABLET | Freq: Every day | ORAL | 1 refills | Status: DC

## 2018-08-13 MED ORDER — BUSPIRONE HCL 10 MG PO TABS: 10.0000 mg | ORAL_TABLET | Freq: Two times a day (BID) | ORAL | 0 refills | Status: DC

## 2018-08-13 NOTE — Progress Notes (Signed)
Psychiatric Initial Adult Assessment   Patient Identification: Alexis Miles MRN:  [id number] Date of Evaluation:  08/13/2018 Referral Source: Joycelyn Man PA Chief Complaint:  ' I am here to establish care." Chief Complaint    Establish Care; Anxiety; Depression; Stress; Fatigue; Insomnia     Visit Diagnosis:    ICD-10-CM   1. MDD (major depressive disorder), recurrent episode, moderate (HCC) F33.1 busPIRone (BUSPAR) 10 MG tablet    sertraline (ZOLOFT) 100 MG tablet  2. GAD (generalized anxiety disorder) F41.1 busPIRone (BUSPAR) 10 MG tablet    sertraline (ZOLOFT) 100 MG tablet  3. Insomnia due to mental condition F51.05     History of Present Illness:  Alexis Miles is a 42 yr old CF, who is single, employed, lives in [place], has a history of depression and anxiety, presented to the clinic today to establish care.  Patient was recently seen in the emergency department on 07/22/2018 for a possible overdose on Xanax.  Patient today reports that she was struggling with severe anxiety symptoms and took some extra pills of Xanax to reduce her anxiety.  Patient reports it was just an impulsive act and she did not want to kill herself by doing so.  Patient reports she has been struggling with depression and anxiety since she her teenage years. Patient describes her depressive symptoms as sadness, lack of motivation, anhedonia, lack of appetite, inability to concentrate and sleep problems.  Patient reports she has had several episodes of depressive symptoms over the years and has tried medications like Prozac and Lexapro prior to the Zoloft.  Patient reports most recently she had the episode few weeks ago when she ended up in the emergency department.  Prior to that episode she started feeling extremely sad, had reduced appetite and hence lost a lot of weight.  Patient reports her medications were changed by her primary medical doctor recently.  She is currently on Zoloft.  She reports that  Zoloft is helpful and her depressive symptoms have improved.Her zoloft was increased while in the ED by psychiatry consult provider.  Patient also reports anxiety symptoms on and off.  She reports her anxiety symptoms as feeling restless, inability to stop worrying, having chills and so on.  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  Patient reports most recently she got overwhelmed at her work and her anxiety started getting worse.  Patient reports she is currently on BuSpar which was changed from Xanax after her emergency department visit.  Patient reports ever since that change she seems to be getting better and her anxiety symptoms are better.  Patient reports sleep problems on and off.  She reports her sleep gets worse when she struggles with worsening mood symptoms.  She reports her sleep is improving now.    Patient denies any suicidality.  Patient denies any homicidality.  Patient denies any perceptual disturbances.  Patient denies any history of OCD symptoms, denies any manic or hypomanic symptoms.  Patient denies any history of trauma.  Patient denies any substance abuse problems.    Associated Signs/Symptoms: Depression Symptoms:  depressed mood, insomnia, difficulty concentrating, anxiety, weight loss, decreased appetite, (Hypo) Manic Symptoms:  denies Anxiety Symptoms:  Excessive Worry, Psychotic Symptoms:  denies PTSD Symptoms: Negative  Past Psychiatric History: Patient denies any inpatient mental health admissions in the past.  Patient was recently seen in the emergency department on 07/22/2018 for a possible overdose on Xanax.  Patient however reports that  it was an impulsive act and she did not want to kill herself due to religious reasons.  She reports she was extremely anxious and decided to take a few more pills that day.  Patient reports a history of depression and anxiety all her life and her medications wear  being managed by her primary medical doctor.  Patient denies any other previous suicide attempts.  Previous Psychotropic Medications: Yes Zoloft, Lexapro, trazodone, Prozac, Xanax  Substance Abuse History in the last 12 months:  No.  Consequences of Substance Abuse: Negative  Past Medical History:  Past Medical History:  Diagnosis Date  . Anxiety   . Diabetes mellitus, type II (HCC)   . PCOS (polycystic ovarian syndrome)     Past Surgical History:  Procedure Laterality Date  . BARTHOLIN GLAND CYST EXCISION    . MOUTH SURGERY     wisdom teeth removed    Family Psychiatric History: 2 sisters-depression and anxiety, mother-depression, anxiety, paternal grandfather-alcoholism.  Family History:  Family History  Problem Relation Age of Onset  . Arthritis Mother   . Lymphoma Mother   . Heart disease Father        MI, CABG, CAD  . Diabetes Father   . Hyperlipidemia Sister   . Depression Sister   . Anxiety disorder Sister   . Cancer Maternal Grandmother        colon, breast   . Diabetes Maternal Grandmother   . Alcohol abuse Paternal Grandfather   . Hypertension Sister   . Depression Sister   . Anxiety disorder Sister   . Diabetes Maternal Aunt   . Lymphoma Maternal Aunt     Social History:   Social History   Socioeconomic History  . Marital status: Single    Spouse name: Not on file  . Number of children: 0  . Years of education: College  . Highest education level: Bachelor's degree (e.g., BA, AB, BS)  Occupational History  . Occupation: Full Time    Employer: [employer]    Comment: Production manager  Social Needs  . Financial resource strain: Not hard at all  . Food insecurity:    Worry: Never true    Inability: Never true  . Transportation needs:    Medical: Yes    Non-medical: Yes  Tobacco Use  . Smoking status: Never Smoker  . Smokeless tobacco: Never Used  Substance and Sexual Activity  . Alcohol use: Not Currently    Comment: Rare  .  Drug use: No  . Sexual activity: Not Currently    Comment: Never sexually active  Lifestyle  . Physical activity:    Days per week: 0 days    Minutes per session: 0 min  . Stress: To some extent  Relationships  . Social connections:    Talks on phone: More than three times a week    Gets together: More than three times a week    Attends religious service: More than 4 times per year    Active member of club or organization: No    Attends meetings of clubs or organizations: Never    Relationship status: Never married  Other Topics Concern  . Not on file  Social History Narrative  . Not on file    Additional Social History: Patient is single.  She lives in [place] with her father.  Her mother is deceased.  She has 2 sisters.  She has a good relationship with her family.  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  Allergies:   Allergies  Allergen Reactions  . Penicillins Other (See Comments)    Has patient had a PCN reaction causing immediate rash, facial/tongue/throat swelling, SOB or lightheadedness with hypotension: Unknown Has patient had a PCN reaction causing severe rash involving mucus membranes or skin necrosis: Unknown Has patient had a PCN reaction that required hospitalization: Unknown Has patient had a PCN reaction occurring within the last 10 years: Unknown If all of the above answers are "NO", then may proceed with Cephalosporin use.   . Trazodone And Nefazodone Other (See Comments)    Suicidal Ideation    Metabolic Disorder Labs: Lab Results  Component Value Date   HGBA1C 5.8 (H) 06/27/2018   No results found for: PROLACTIN Lab Results  Component Value Date   CHOL 183 06/27/2018   TRIG 95 06/27/2018   HDL 47 06/27/2018   CHOLHDL 3.9 06/27/2018   LDLCALC 117 (H) 06/27/2018   LDLCALC 124 (H) 02/08/2017     Current Medications: Current Outpatient Medications  Medication Sig Dispense Refill  . acetaminophen (TYLENOL) 500 MG tablet Take 500  mg by mouth every 6 (six) hours as needed for mild pain or headache.     . busPIRone (BUSPAR) 10 MG tablet Take 1 tablet (10 mg total) by mouth 2 (two) times daily. 180 tablet 0  . metFORMIN (GLUCOPHAGE-XR) 500 MG 24 hr tablet Take 1 tablet (500 mg total) by mouth daily with breakfast. 90 tablet 1  . sertraline (ZOLOFT) 100 MG tablet Take 1 tablet (100 mg total) by mouth daily. 90 tablet 1  . Vitamin D, Ergocalciferol, (DRISDOL) 50000 units CAPS capsule Take 1 capsule (50,000 Units total) by mouth every 7 (seven) days. 12 capsule 1   No current facility-administered medications for this visit.     Neurologic: Headache: No Seizure: No Paresthesias:No  Musculoskeletal: Strength & Muscle Tone: within normal limits Gait & Station: normal Patient leans: N/A  Psychiatric Specialty Exam: Review of Systems  Psychiatric/Behavioral: Positive for depression. The patient is nervous/anxious.   All other systems reviewed and are negative.   Blood pressure (!) 144/83, pulse 99, temperature 97.8 F (36.6 C), temperature source Oral, weight (!) 340 lb 3.2 oz (154.3 kg), last menstrual period 06/25/2018.Body mass index is 56.61 kg/m.  General Appearance: Casual  Eye Contact:  Fair  Speech:  Clear and Coherent  Volume:  Normal  Mood:  Depressed improving  Affect:  Appropriate  Thought Process:  Goal Directed and Descriptions of Associations: Intact  Orientation:  Full (Time, Place, and Person)  Thought Content:  Logical  Suicidal Thoughts:  No  Homicidal Thoughts:  No  Memory:  Immediate;   Fair Recent;   Fair Remote;   Fair  Judgement:  Fair  Insight:  Fair  Psychomotor Activity:  Normal  Concentration:  Concentration: Fair and Attention Span: Fair  Recall:  [employer] of Knowledge:Fair  Language: Fair  Akathisia:  No  Handed:  Right  AIMS (if indicated):  na  Assets:  Communication Skills Desire for Improvement Financial Resources/Insurance Housing Social  Support Talents/Skills Transportation Vocational/Educational  ADL's:  Intact  Cognition: WNL  Sleep:  improving    Treatment Plan Summary:Alexis Miles is a 42 year old Caucasian female, single, employed, lives in [place], has a history of depression, anxiety, PCOS, presented to the clinic today to establish care.  Patient was recently treated in the emergency department for possible overdose on medications.  Patient had some worsening depression and anxiety symptoms a few  weeks ago however currently is improving.  Patient is biologically predisposed given her family history of mental health problems.  Patient also has psychosocial stressors of her work.  Patient denies any suicidality at this time and is motivated to start treatment as well as pursue psychotherapy.  Plan as noted below. Medication management and Plan as noted below Plan For MDD PHQ 9 today 7 Continue Zoloft 100 mg p.o. daily Continue BuSpar 10 mg p.o. twice daily Referral for psychotherapy.  For GAD GAD 7 equals 6 Continue Zoloft and BuSpar as prescribed  For insomnia Provided sleep hygiene techniques Patient can use melatonin as needed  Reviewed TSH in EHR done on 7/19/ 2019-within normal limits.  I have reviewed medical records in EHR Per Dr.Pucilowska - 07/22/2018.  Follow-up in clinic in 1 month or sooner if needed.  More than 50 % of the time was spent for psychoeducation and supportive psychotherapy and care coordination.  This note was generated in part or whole with voice recognition software. Voice recognition is usually quite accurate but there are transcription errors that can and very often do occur. I apologize for any typographical errors that were not detected and corrected.      Jomarie Longs, MD 9/4/20193:44 PM

## 2018-08-19 ENCOUNTER — Ambulatory Visit: Payer: BC Managed Care – PPO | Admitting: Licensed Clinical Social Worker

## 2018-08-19 DIAGNOSIS — F411 Generalized anxiety disorder: Secondary | ICD-10-CM

## 2018-08-19 NOTE — Progress Notes (Signed)
Comprehensive Clinical Assessment (CCA) Note  08/19/2018 Alexis Miles [id number]  Visit Diagnosis:      ICD-10-CM   1. GAD (generalized anxiety disorder) F41.1       CCA Part One  Part One has been completed on paper by the patient.  (See scanned document in Chart Review)  CCA Part Two A  Intake/Chief Complaint:  CCA Intake With Chief Complaint CCA Part Two Date: 08/19/18 CCA Part Two Time: 1430 Chief Complaint/Presenting Problem: "Towards the beginning of August, I took too many Xanax. I don't think I was trying to kill myself. A doctor in the ER recommended that I come."  Patients Currently Reported Symptoms/Problems: "I'm good now. What led up to that point, is my anxiety got really bad. I was having physical symptoms."  Collateral Involvement: None Individual's Strengths: "I'm a really hard worker. I'm very detail oriented. I'm a very loyal friend."  Individual's Preferences: outpatient therapy  Individual's Abilities: good communication Type of Services Patient Feels Are Needed: outpatient therapy, med management Initial Clinical Notes/Concerns: Reports decrease in symptoms since overdose in August.   Mental Health Symptoms Depression:  Depression: N/A  Mania:  Mania: N/A  Anxiety:   Anxiety: Sleep(sometimes waking up in the middle of the night.)  Psychosis:  Psychosis: N/A  Trauma:  Trauma: N/A  Obsessions:     Compulsions:  Compulsions: N/A  Inattention:  Inattention: N/A  Hyperactivity/Impulsivity:  Hyperactivity/Impulsivity: N/A  Oppositional/Defiant Behaviors:  Oppositional/Defiant Behaviors: N/A  Borderline Personality:  Emotional Irregularity: N/A  Other Mood/Personality Symptoms:  Other Mood/Personality Symtpoms: Pt denies symptoms since August.    Mental Status Exam Appearance and self-care  Stature:  Stature: Average  Weight:  Weight: Overweight  Clothing:  Clothing: Neat/clean  Grooming:  Grooming: Normal  Cosmetic use:  Cosmetic Use: Age appropriate   Posture/gait:  Posture/Gait: Normal  Motor activity:  Motor Activity: Not Remarkable  Sensorium  Attention:  Attention: Normal  Concentration:  Concentration: Normal  Orientation:  Orientation: X5  Recall/memory:  Recall/Memory: Normal  Affect and Mood  Affect:  Affect: Appropriate  Mood:  Mood: Anxious  Relating  Eye contact:  Eye Contact: Normal  Facial expression:  Facial Expression: Responsive  Attitude toward examiner:  Attitude Toward Examiner: Cooperative  Thought and Language  Speech flow: Speech Flow: Normal  Thought content:  Thought Content: Appropriate to mood and circumstances  Preoccupation:  Preoccupations: (None)  Hallucinations:  Hallucinations: (N/A)  Organization:     Company secretary of Knowledge:  Fund of Knowledge: Average  Intelligence:  Intelligence: Average  Abstraction:  Abstraction: Normal  Judgement:  Judgement: Normal  Reality Testing:  Reality Testing: Realistic  Insight:  Insight: Good  Decision Making:  Decision Making: Normal  Social Functioning  Social Maturity:  Social Maturity: Responsible  Social Judgement:  Social Judgement: Normal  Stress  Stressors:  Stressors: Grief/losses  Coping Ability:  Coping Ability: Normal  Skill Deficits:     Supports:      Family and Psychosocial History: Family history Marital status: Single Are you sexually active?: No What is your sexual orientation?: Heterosexual  Has your sexual activity been affected by drugs, alcohol, medication, or emotional stress?: N/A Does patient have children?: No  Childhood History:  Childhood History By whom was/is the patient raised?: Both parents Additional childhood history information: None reported Description of patient's relationship with caregiver when they were a child: "Good, very good."  Patient's description of current relationship with people who raised him/her: Mother passed away three years  ago, father is still living. "I still live at home  iwht my dad."  How were you disciplined when you got in trouble as a child/adolescent?: "My parents did not believe in spanking."  Does patient have siblings?: Yes Number of Siblings: 2 Description of patient's current relationship with siblings: Two older sisters. "My relationship with them is good. When we were younger, it would get bad sometimes. But it's a lot better now."  Did patient suffer any verbal/emotional/physical/sexual abuse as a child?: No Did patient suffer from severe childhood neglect?: No Has patient ever been sexually abused/assaulted/raped as an adolescent or adult?: No Was the patient ever a victim of a crime or a disaster?: No Witnessed domestic violence?: No Has patient been effected by domestic violence as an adult?: No  CCA Part Two B  Employment/Work Situation: Employment / Work Psychologist, occupational Employment situation: Employed Where is patient currently employed?: [employer], Neurosurgeon  How long has patient been employed?: 16 years  Patient's job has been impacted by current illness: No What is the longest time patient has a held a job?: 16 years Where was the patient employed at that time?: Current job  Did You Receive Any Psychiatric Treatment/Services While in Equities trader?: (N/A) Are There Guns or Other Weapons in Your Home?: No Are These Comptroller?: (N/A)  Education: Education School Currently Attending: No Last Grade Completed: 12 Name of High School: [employer]  Did [place] Graduate From [employer]?: Yes Did Theme park manager?: Yes What Type of College Degree Do you Have?: [employer], Public history  Did Designer, television/film set?: No What Was Your Major?: Public History  Did You Have Any Scientist, research (life sciences) In School?: history, biology  Did You Have An Individualized Education Program (IIEP): No Did You Have Any Difficulty At [employer]?: No  Religion: Religion/Spirituality Are You A Religious Person?: Yes What  is Your Religious Affiliation?: Baptist How Might This Affect Treatment?: "It gives me more hope that things will be better."   Leisure/Recreation: Leisure / Recreation Leisure and Hobbies: "I like to read, watch TV, and travel."   Exercise/Diet: Exercise/Diet Do You Exercise?: No Have You Gained or Lost A Significant Amount of Weight in the Past Six Months?: Yes-Lost Number of Pounds Lost?: 20 Do You Follow a Special Diet?: No Do You Have Any Trouble Sleeping?: Yes Explanation of Sleeping Difficulties: trouble staying asleep   CCA Part Two C  Alcohol/Drug Use: Alcohol / Drug Use Pain Medications: None Prescriptions: Buspirone, Zoloft, Metformin, Vitamin D Over the Counter: N/A History of alcohol / drug use?: No history of alcohol / drug abuse Longest period of sobriety (when/how long): No history of use.                       CCA Part Three  ASAM's:  Six Dimensions of Multidimensional Assessment  Dimension 1:  Acute Intoxication and/or Withdrawal Potential:     Dimension 2:  Biomedical Conditions and Complications:     Dimension 3:  Emotional, Behavioral, or Cognitive Conditions and Complications:     Dimension 4:  Readiness to Change:     Dimension 5:  Relapse, Continued use, or Continued Problem Potential:     Dimension 6:  Recovery/Living Environment:      Substance use Disorder (SUD)    Social Function:  Social Functioning Social Maturity: Responsible Social Judgement: Normal  Stress:  Stress Stressors: Grief/losses Coping Ability: Normal Patient Takes Medications The Way The Doctor  Instructed?: Yes Priority Risk: Moderate Risk  Risk Assessment- Self-Harm Potential: Risk Assessment For Self-Harm Potential Thoughts of Self-Harm: No current thoughts Method: No plan Availability of Means: No access/NA Additional Information for Self-Harm Potential: Previous Attempts Additional Comments for Self-Harm Potential: "I don't know if I was trying to kill  myself but I took six Xanax." August 2019  Risk Assessment -Dangerous to Others Potential: Risk Assessment For Dangerous to Others Potential Method: No Plan Availability of Means: No access or NA Intent: Vague intent or NA Notification Required: No need or identified person Additional Information for Danger to Others Potential: (N/A) Additional Comments for Danger to Others Potential: N/A  DSM5 Diagnoses: Patient Active Problem List   Diagnosis Date Noted  . Major depressive disorder, recurrent episode with anxious distress (HCC) 07/22/2018  . Depression 06/27/2018  . Morbid obesity (HCC) 05/04/2016  . Anxiety 12/28/2015  . Borderline high serum cholesterol 12/28/2015  . Accumulation of fluid in tissues 12/28/2015  . Fatigue 12/28/2015  . Blood glucose elevated 12/28/2015  . Migraine 12/28/2015  . PCOS (polycystic ovarian syndrome) 12/28/2015  . Avitaminosis D 12/28/2015  . Insomnia 12/28/2015    Patient Centered Plan: Patient is on the following Treatment Plan(s):  Anxiety  Recommendations for Services/Supports/Treatments: Recommendations for Services/Supports/Treatments Recommendations For Services/Supports/Treatments: Individual Therapy, Medication Management  Treatment Plan Summary: Sharlisa reported a significant decrease in her anxiety and depression symptoms since her overdose on Xanax and consequent medication change. She reports improvement with her sleep, appetite, and physical symptoms. Philomene reports she does not feel she needs weekly therapy, but was in agreement with attending monthly sessions. We will utilize CBT and DBT skills to increase her ability to manage anxiety and depression symptoms.     Referrals to Alternative Service(s): Referred to Alternative Service(s):   Place:   Date:   Time:    Referred to Alternative Service(s):   Place:   Date:   Time:    Referred to Alternative Service(s):   Place:   Date:   Time:    Referred to Alternative Service(s):    Place:   Date:   Time:     Heidi Dach, LCSW

## 2018-09-05 ENCOUNTER — Ambulatory Visit: Payer: BC Managed Care – PPO | Admitting: Physician Assistant

## 2018-09-05 ENCOUNTER — Encounter: Payer: Self-pay | Admitting: Physician Assistant

## 2018-09-05 VITALS — BP 122/80 | HR 72 | Temp 98.6°F | Resp 16 | Wt 345.5 lb

## 2018-09-05 DIAGNOSIS — Z23 Encounter for immunization: Secondary | ICD-10-CM | POA: Diagnosis not present

## 2018-09-05 DIAGNOSIS — F5101 Primary insomnia: Secondary | ICD-10-CM

## 2018-09-05 DIAGNOSIS — F339 Major depressive disorder, recurrent, unspecified: Secondary | ICD-10-CM | POA: Diagnosis not present

## 2018-09-05 NOTE — Progress Notes (Signed)
Patient: Alexis Miles Female    DOB: 1976/03/13   42 y.o.   MRN: [id number] Visit Date: 09/05/2018  Today's Provider: Margaretann Loveless, PA-C   Chief Complaint  Patient presents with  . Follow-up    Depression and GAD   Subjective:    HPI  GAD and Depression Follow up:  The patient was last seen for GAD and Depression 6 weeks ago. Changes made since that visit include Sertaline 50mg  and was Increased later to 100mg  after ED which patient was seen on 07/22/18. The psychiatrist Dr. Elna Breslow took her of the alprazolam and added the Buspar 10mg  BID.  She reports excellent compliance with treatment. She is not having side effects. Marland Kitchen  ------------------------------------------------------------------------ Depression screen [facility] 2/9 09/05/2018 06/27/2018 05/04/2016  Decreased Interest 0 1 0  Down, Depressed, Hopeless 1 1 1   PHQ - 2 Score 1 2 1   Altered sleeping 1 1 -  Tired, decreased energy 1 1 -  Change in appetite 3 1 -  Feeling bad or failure about yourself  1 1 -  Trouble concentrating 1 1 -  Moving slowly or fidgety/restless 0 0 -  Suicidal thoughts 0 0 -  PHQ-9 Score 8 7 -  Difficult doing work/chores Somewhat difficult Somewhat difficult -   GAD 7 : Generalized Anxiety Score 09/05/2018  Nervous, Anxious, on Edge 0  Control/stop worrying 1  Worry too much - different things 1  Trouble relaxing 1  Restless 0  Easily annoyed or irritable 2  Afraid - awful might happen 0  Total GAD 7 Score 5  Anxiety Difficulty Not difficult at all      Allergies  Allergen Reactions  . Penicillins Other (See Comments)    Has patient had a PCN reaction causing immediate rash, facial/tongue/throat swelling, SOB or lightheadedness with hypotension: Unknown Has patient had a PCN reaction causing severe rash involving mucus membranes or skin necrosis: Unknown Has patient had a PCN reaction that required hospitalization: Unknown Has patient had a PCN reaction occurring within  the last 10 years: Unknown If all of the above answers are "NO", then may proceed with Cephalosporin use.   . Trazodone And Nefazodone Other (See Comments)    Suicidal Ideation     Current Outpatient Medications:  .  acetaminophen (TYLENOL) 500 MG tablet, Take 500 mg by mouth every 6 (six) hours as needed for mild pain or headache. , Disp: , Rfl:  .  busPIRone (BUSPAR) 10 MG tablet, Take 1 tablet (10 mg total) by mouth 2 (two) times daily., Disp: 180 tablet, Rfl: 0 .  metFORMIN (GLUCOPHAGE-XR) 500 MG 24 hr tablet, Take 1 tablet (500 mg total) by mouth daily with breakfast., Disp: 90 tablet, Rfl: 1 .  sertraline (ZOLOFT) 100 MG tablet, Take 1 tablet (100 mg total) by mouth daily., Disp: 90 tablet, Rfl: 1 .  Vitamin D, Ergocalciferol, (DRISDOL) 50000 units CAPS capsule, Take 1 capsule (50,000 Units total) by mouth every 7 (seven) days., Disp: 12 capsule, Rfl: 1  Review of Systems  Constitutional: Negative.   Respiratory: Negative.   Cardiovascular: Negative.   Gastrointestinal: Negative.   Neurological: Negative.   Psychiatric/Behavioral: Negative.     Social History   Tobacco Use  . Smoking status: Never Smoker  . Smokeless tobacco: Never Used  Substance Use Topics  . Alcohol use: Not Currently    Comment: Rare   Objective:   BP 122/80 (BP Location: Left Wrist, Patient Position: Sitting, Cuff Size: Normal)  Pulse 72   Temp 98.6 F (37 C) (Oral)   Resp 16   Wt (!) 345 lb 8 oz (156.7 kg)   LMP 06/17/2018   BMI 57.49 kg/m  Vitals:   09/05/18 1615  BP: 122/80  Pulse: 72  Resp: 16  Temp: 98.6 F (37 C)  TempSrc: Oral  Weight: (!) 345 lb 8 oz (156.7 kg)     Physical Exam  Constitutional: She appears well-developed and well-nourished.  HENT:  Head: Normocephalic and atraumatic.  Eyes: EOM are normal.  Neck: Normal range of motion. Neck supple.  Pulmonary/Chest: Effort normal. No respiratory distress.  Psychiatric: She has a normal mood and affect. Her behavior  is normal. Judgment and thought content normal.  Vitals reviewed.       Assessment & Plan:     1. Major depressive disorder, recurrent episode with anxious distress (HCC) Doing much better. Continue sertraline 100mg  nightly, Buspar 10mg  BID. Has one more f/u with Dr. Elna Breslow scheduled and also a visit with a counselor.   2. Primary insomnia Improved.   3. Need for influenza vaccination Flu vaccine given today without complication. Patient sat upright for 15 minutes to check for adverse reaction before being released. - Flu Vaccine QUAD 36+ mos IM       Margaretann Loveless, PA-C  [facility]

## 2018-09-10 ENCOUNTER — Other Ambulatory Visit: Payer: Self-pay

## 2018-09-10 ENCOUNTER — Encounter: Payer: Self-pay | Admitting: Psychiatry

## 2018-09-10 ENCOUNTER — Ambulatory Visit: Payer: BC Managed Care – PPO | Admitting: Psychiatry

## 2018-09-10 VITALS — BP 110/74 | HR 93 | Temp 98.1°F | Wt 340.0 lb

## 2018-09-10 DIAGNOSIS — F411 Generalized anxiety disorder: Secondary | ICD-10-CM

## 2018-09-10 DIAGNOSIS — F331 Major depressive disorder, recurrent, moderate: Secondary | ICD-10-CM

## 2018-09-10 DIAGNOSIS — F5105 Insomnia due to other mental disorder: Secondary | ICD-10-CM | POA: Diagnosis not present

## 2018-09-10 NOTE — Progress Notes (Signed)
BH MD OP Progress Note  09/10/2018 4:42 PM Alexis Miles  MRN:  [id number]  Chief Complaint: ' I am here for follow up." Chief Complaint    Follow-up; Medication Refill     HPI: Alexis Miles is a 42 year old Caucasian female who is single, employed, lives in [place], has a history of depression, anxiety, presented to the clinic today for a follow-up visit.  Patient today reports her mood symptoms as improving.  She denies any sadness, crying spells, lack of appetite and so on.  She reports her concentration and focus as improving.  She reports her anxiety symptoms is better.  She reports she is compliant on her medications as prescribed.  She denies any side effects to the medication.  Patient reports work is going well.  She reports she enjoys her work.  She reports she does feel overwhelmed at times when she has a lot of work related activities.  She however has been coping well.  She has been making use of her support system like her father, sister and so on.  She reports she has been going for walks often.  Patient denies any suicidality.  Patient denies any perceptual disturbances.  Patient has upcoming appointment with our therapist Ms. Heidi Dach next week.  She reports she is looking forward to the same.  Visit Diagnosis:    ICD-10-CM   1. MDD (major depressive disorder), recurrent episode, moderate (HCC) F33.1    improving  2. GAD (generalized anxiety disorder) F41.1   3. Insomnia due to mental condition F51.05    improving    Past Psychiatric History: Reviewed past psychiatric history from my progress note on 08/13/2018.  Past trials of Zoloft, Lexapro, trazodone, Prozac, Xanax.  Past Medical History:  Past Medical History:  Diagnosis Date  . Anxiety   . Diabetes mellitus, type II (HCC)   . PCOS (polycystic ovarian syndrome)     Past Surgical History:  Procedure Laterality Date  . BARTHOLIN GLAND CYST EXCISION    . MOUTH SURGERY     wisdom teeth removed     Family Psychiatric History: Reviewed family psychiatric history from my progress note on 08/13/2018.  Family History:  Family History  Problem Relation Age of Onset  . Arthritis Mother   . Lymphoma Mother   . Heart disease Father        MI, CABG, CAD  . Diabetes Father   . Hyperlipidemia Sister   . Depression Sister   . Anxiety disorder Sister   . Cancer Maternal Grandmother        colon, breast   . Diabetes Maternal Grandmother   . Alcohol abuse Paternal Grandfather   . Hypertension Sister   . Depression Sister   . Anxiety disorder Sister   . Diabetes Maternal Aunt   . Lymphoma Maternal Aunt     Social History: Reviewed social history from my progress note on 08/13/2018. Social History   Socioeconomic History  . Marital status: Single    Spouse name: Not on file  . Number of children: 0  . Years of education: College  . Highest education level: Bachelor's degree (e.g., BA, AB, BS)  Occupational History  . Occupation: Full Time    Employer: [employer]    Comment: Production manager  Social Needs  . Financial resource strain: Not hard at all  . Food insecurity:    Worry: Never true    Inability: Never true  . Transportation needs:    Medical: Yes  Non-medical: Yes  Tobacco Use  . Smoking status: Never Smoker  . Smokeless tobacco: Never Used  Substance and Sexual Activity  . Alcohol use: Not Currently    Comment: Rare  . Drug use: No  . Sexual activity: Not Currently    Comment: Never sexually active  Lifestyle  . Physical activity:    Days per week: 0 days    Minutes per session: 0 min  . Stress: To some extent  Relationships  . Social connections:    Talks on phone: More than three times a week    Gets together: More than three times a week    Attends religious service: More than 4 times per year    Active member of club or organization: No    Attends meetings of clubs or organizations: Never    Relationship status: Never married  Other  Topics Concern  . Not on file  Social History Narrative  . Not on file    Allergies:  Allergies  Allergen Reactions  . Penicillins Other (See Comments)    Has patient had a PCN reaction causing immediate rash, facial/tongue/throat swelling, SOB or lightheadedness with hypotension: Unknown Has patient had a PCN reaction causing severe rash involving mucus membranes or skin necrosis: Unknown Has patient had a PCN reaction that required hospitalization: Unknown Has patient had a PCN reaction occurring within the last 10 years: Unknown If all of the above answers are "NO", then may proceed with Cephalosporin use.   . Trazodone And Nefazodone Other (See Comments)    Suicidal Ideation    Metabolic Disorder Labs: Lab Results  Component Value Date   HGBA1C 5.8 (H) 06/27/2018   No results found for: PROLACTIN Lab Results  Component Value Date   CHOL 183 06/27/2018   TRIG 95 06/27/2018   HDL 47 06/27/2018   CHOLHDL 3.9 06/27/2018   LDLCALC 117 (H) 06/27/2018   LDLCALC 124 (H) 02/08/2017   Lab Results  Component Value Date   TSH 1.580 06/27/2018   TSH 2.130 05/21/2016    Therapeutic Level Labs: No results found for: LITHIUM No results found for: VALPROATE No components found for:  CBMZ  Current Medications: Current Outpatient Medications  Medication Sig Dispense Refill  . acetaminophen (TYLENOL) 500 MG tablet Take 500 mg by mouth every 6 (six) hours as needed for mild pain or headache.     . busPIRone (BUSPAR) 10 MG tablet Take 1 tablet (10 mg total) by mouth 2 (two) times daily. 180 tablet 0  . metFORMIN (GLUCOPHAGE-XR) 500 MG 24 hr tablet Take 1 tablet (500 mg total) by mouth daily with breakfast. 90 tablet 1  . sertraline (ZOLOFT) 100 MG tablet Take 1 tablet (100 mg total) by mouth daily. 90 tablet 1  . Vitamin D, Ergocalciferol, (DRISDOL) 50000 units CAPS capsule Take 1 capsule (50,000 Units total) by mouth every 7 (seven) days. 12 capsule 1   No current  facility-administered medications for this visit.      Musculoskeletal: Strength & Muscle Tone: within normal limits Gait & Station: normal Patient leans: N/A  Psychiatric Specialty Exam: Review of Systems  Psychiatric/Behavioral: The patient is nervous/anxious.   All other systems reviewed and are negative.   Blood pressure 110/74, pulse 93, temperature 98.1 F (36.7 C), temperature source Oral, weight (!) 340 lb (154.2 kg).Body mass index is 56.58 kg/m.  General Appearance: Casual  Eye Contact:  Fair  Speech:  Normal Rate  Volume:  Normal  Mood:  Anxious improving  Affect:  Congruent  Thought Process:  Goal Directed and Descriptions of Associations: Intact  Orientation:  Full (Time, Place, and Person)  Thought Content: Logical   Suicidal Thoughts:  No  Homicidal Thoughts:  No  Memory:  Immediate;   Fair Recent;   Fair Remote;   Fair  Judgement:  Fair  Insight:  Fair  Psychomotor Activity:  Normal  Concentration:  Concentration: Fair and Attention Span: Fair  Recall:  [employer] of Knowledge: Fair  Language: Fair  Akathisia:  No  Handed:  Right  AIMS (if indicated): na  Assets:  Communication Skills Desire for Improvement Social Support  ADL's:  Intact  Cognition: WNL  Sleep:  Fair   Screenings: GAD-7     Office Visit from 09/05/2018 in [facility]  Total GAD-7 Score  5    PHQ2-9     Office Visit from 09/05/2018 in [facility] Office Visit from 06/27/2018 in [facility] Office Visit from 05/04/2016 in [facility]  PHQ-2 Total Score  1  2  1   PHQ-9 Total Score  8  7  -       Assessment and Plan: Alexis Miles is a 41 yr old Caucasian female, single, employed, lives in [place], has a history of depression, anxiety, PCO S, presented to clinic today for a follow-up visit.  Patient was recently treated in the emergency department for possible overdose on medication.  Patient is biologically predisposed  given her family history of mental health problems.  She also has psychosocial stressors of her work.  She however currently is doing well on the current medication regimen.  She denies any suicidality or homicidality and has good social support system.  She is motivated to start psychotherapy session soon.  Plan as noted below.  Plan For MDD Zoloft 100 mg p.o. daily. Continue BuSpar 10 mg p.o. twice daily Referred for psychotherapy with Ms. Tasia Catchings.  For GAD Continue Zoloft and BuSpar as prescribed  For insomnia Provided sleep hygiene techniques.  She reports sleep is improved. She will continue to use melatonin as needed.  Follow up in clinic in 6 weeks or sooner if needed.  More than 50 % of the time was spent for psychoeducation and supportive psychotherapy and care coordination.  This note was generated in part or whole with voice recognition software. Voice recognition is usually quite accurate but there are transcription errors that can and very often do occur. I apologize for any typographical errors that were not detected and corrected.         Jomarie Longs, MD 09/10/2018, 4:42 PM

## 2018-09-18 ENCOUNTER — Ambulatory Visit: Payer: BC Managed Care – PPO | Admitting: Licensed Clinical Social Worker

## 2018-09-25 ENCOUNTER — Ambulatory Visit: Payer: BC Managed Care – PPO | Admitting: Licensed Clinical Social Worker

## 2018-09-25 ENCOUNTER — Encounter: Payer: Self-pay | Admitting: Licensed Clinical Social Worker

## 2018-09-25 DIAGNOSIS — F331 Major depressive disorder, recurrent, moderate: Secondary | ICD-10-CM | POA: Diagnosis not present

## 2018-09-25 NOTE — Progress Notes (Signed)
   THERAPIST PROGRESS NOTE  Session Time: 0800  Participation Level: Minimal  Behavioral Response: NeatAlertNA  Type of Therapy: Individual Therapy  Treatment Goals addressed: Anxiety  Interventions: Supportive  Summary: Alexis Miles is a 42 y.o. female who presents with anxiety and depression. Modena reports continuing to feel "great," since our last session. She reports work is going well, but stated there are some changes coming from the [employer] that may affect her work flow, but she is able to manage her anxiety around that change. She stated her sleep pattern has returned to "normal for her," sleeping well once falling asleep--though she may be up until 1 or 2AM. She reports no depression, and states, "life is boring right now, which is good."   Suicidal/Homicidal: No  Therapist Response: Pallavi reports feeling she does not need monthly therapy, and states she wants to continue on an "as needed," basis. LCSW expressed that Ellianna could call anytime to schedule an appointment. LCSW suggested she continues to utilize CBT and DBT skills to manage anxiety when it presents itself.   Plan: Return again when needed.   Diagnosis: Axis I: Major Depression, Recurrent severe    Axis II: No diagnosis    Heidi Dach, LCSW 09/25/2018

## 2018-10-06 ENCOUNTER — Other Ambulatory Visit: Payer: Self-pay | Admitting: Physician Assistant

## 2018-10-06 DIAGNOSIS — Z1231 Encounter for screening mammogram for malignant neoplasm of breast: Secondary | ICD-10-CM

## 2018-10-22 ENCOUNTER — Other Ambulatory Visit: Payer: Self-pay

## 2018-10-22 ENCOUNTER — Encounter: Payer: Self-pay | Admitting: Psychiatry

## 2018-10-22 ENCOUNTER — Ambulatory Visit: Payer: BC Managed Care – PPO | Admitting: Psychiatry

## 2018-10-22 VITALS — BP 157/93 | HR 88 | Temp 97.3°F | Wt 353.6 lb

## 2018-10-22 DIAGNOSIS — F331 Major depressive disorder, recurrent, moderate: Secondary | ICD-10-CM

## 2018-10-22 DIAGNOSIS — F5105 Insomnia due to other mental disorder: Secondary | ICD-10-CM | POA: Diagnosis not present

## 2018-10-22 DIAGNOSIS — F411 Generalized anxiety disorder: Secondary | ICD-10-CM

## 2018-10-22 MED ORDER — BUSPIRONE HCL 10 MG PO TABS: 10.0000 mg | ORAL_TABLET | Freq: Two times a day (BID) | ORAL | 0 refills | Status: DC

## 2018-10-22 NOTE — Progress Notes (Signed)
BH MD OP Progress Note  10/22/2018 5:36 PM Alexis Miles  MRN:  [id number]  Chief Complaint: ' I am here for follow up.' Chief Complaint    Follow-up; Medication Refill     HPI: Alexis Miles is a 42 year old Caucasian female who is single, employed, lives in [place] has a history of depression, anxiety, presented to the clinic today for a follow-up visit.  Patient reports she is currently doing well on the current medication regimen.  She denies any significant mood symptoms.  She denies any sleep problems.  She reports she is doing well on the melatonin as needed.  She continues to tolerate her Zoloft and BuSpar.  She denies any suicidality.  She denies any perceptual disturbances.  She reports work is slowing down since the holidays are coming up.  She looks forward to Thanksgiving and reports she usually spend it with family.  She denies any other concerns today.   Visit Diagnosis:    ICD-10-CM   1. MDD (major depressive disorder), recurrent episode, moderate (HCC) F33.1 busPIRone (BUSPAR) 10 MG tablet   improving  2. GAD (generalized anxiety disorder) F41.1 busPIRone (BUSPAR) 10 MG tablet   improving  3. Insomnia due to mental condition F51.05    improved    Past Psychiatric History: Have reviewed past psychiatric history from my progress note on 08/13/2018.  Past trials of Zoloft, Lexapro, trazodone, Prozac, Xanax  Past Medical History:  Past Medical History:  Diagnosis Date  . Anxiety   . Diabetes mellitus, type II (HCC)   . PCOS (polycystic ovarian syndrome)     Past Surgical History:  Procedure Laterality Date  . BARTHOLIN GLAND CYST EXCISION    . MOUTH SURGERY     wisdom teeth removed    Family Psychiatric History: Reviewed family psychiatric history from my progress note on 08/13/2018.  Family History:  Family History  Problem Relation Age of Onset  . Arthritis Mother   . Lymphoma Mother   . Heart disease Father        MI, CABG, CAD  . Diabetes Father    . Hyperlipidemia Sister   . Depression Sister   . Anxiety disorder Sister   . Cancer Maternal Grandmother        colon, breast   . Diabetes Maternal Grandmother   . Alcohol abuse Paternal Grandfather   . Hypertension Sister   . Depression Sister   . Anxiety disorder Sister   . Diabetes Maternal Aunt   . Lymphoma Maternal Aunt     Social History: I have reviewed social history from my progress note on 08/13/2018 Social History   Socioeconomic History  . Marital status: Single    Spouse name: Not on file  . Number of children: 0  . Years of education: College  . Highest education level: Bachelor's degree (e.g., BA, AB, BS)  Occupational History  . Occupation: Full Time    Employer: [employer]    Comment: Production manager  Social Needs  . Financial resource strain: Not hard at all  . Food insecurity:    Worry: Never true    Inability: Never true  . Transportation needs:    Medical: Yes    Non-medical: Yes  Tobacco Use  . Smoking status: Never Smoker  . Smokeless tobacco: Never Used  Substance and Sexual Activity  . Alcohol use: Not Currently    Comment: Rare  . Drug use: No  . Sexual activity: Not Currently    Comment: Never sexually  active  Lifestyle  . Physical activity:    Days per week: 0 days    Minutes per session: 0 min  . Stress: To some extent  Relationships  . Social connections:    Talks on phone: More than three times a week    Gets together: More than three times a week    Attends religious service: More than 4 times per year    Active member of club or organization: No    Attends meetings of clubs or organizations: Never    Relationship status: Never married  Other Topics Concern  . Not on file  Social History Narrative  . Not on file    Allergies:  Allergies  Allergen Reactions  . Penicillins Other (See Comments)    Has patient had a PCN reaction causing immediate rash, facial/tongue/throat swelling, SOB or lightheadedness  with hypotension: Unknown Has patient had a PCN reaction causing severe rash involving mucus membranes or skin necrosis: Unknown Has patient had a PCN reaction that required hospitalization: Unknown Has patient had a PCN reaction occurring within the last 10 years: Unknown If all of the above answers are "NO", then may proceed with Cephalosporin use.   . Trazodone And Nefazodone Other (See Comments)    Suicidal Ideation    Metabolic Disorder Labs: Lab Results  Component Value Date   HGBA1C 5.8 (H) 06/27/2018   No results found for: PROLACTIN Lab Results  Component Value Date   CHOL 183 06/27/2018   TRIG 95 06/27/2018   HDL 47 06/27/2018   CHOLHDL 3.9 06/27/2018   LDLCALC 117 (H) 06/27/2018   LDLCALC 124 (H) 02/08/2017   Lab Results  Component Value Date   TSH 1.580 06/27/2018   TSH 2.130 05/21/2016    Therapeutic Level Labs: No results found for: LITHIUM No results found for: VALPROATE No components found for:  CBMZ  Current Medications: Current Outpatient Medications  Medication Sig Dispense Refill  . acetaminophen (TYLENOL) 500 MG tablet Take 500 mg by mouth every 6 (six) hours as needed for mild pain or headache.     . busPIRone (BUSPAR) 10 MG tablet Take 1 tablet (10 mg total) by mouth 2 (two) times daily. 180 tablet 0  . sertraline (ZOLOFT) 100 MG tablet Take 1 tablet (100 mg total) by mouth daily. 90 tablet 1  . Vitamin D, Ergocalciferol, (DRISDOL) 50000 units CAPS capsule Take 1 capsule (50,000 Units total) by mouth every 7 (seven) days. 12 capsule 1  . metFORMIN (GLUCOPHAGE-XR) 500 MG 24 hr tablet Take 1 tablet (500 mg total) by mouth daily with breakfast. 90 tablet 1   No current facility-administered medications for this visit.      Musculoskeletal: Strength & Muscle Tone: within normal limits Gait & Station: normal Patient leans: N/A  Psychiatric Specialty Exam: Review of Systems  Psychiatric/Behavioral: Negative for depression. The patient is not  nervous/anxious and does not have insomnia.   All other systems reviewed and are negative.   Blood pressure (!) 157/93, pulse 88, temperature (!) 97.3 F (36.3 C), temperature source Oral, weight (!) 353 lb 9.6 oz (160.4 kg).Body mass index is 58.84 kg/m.  General Appearance: Casual  Eye Contact:  Fair  Speech:  Normal Rate  Volume:  Normal  Mood:  Euthymic  Affect:  Congruent  Thought Process:  Goal Directed and Descriptions of Associations: Intact  Orientation:  Full (Time, Place, and Person)  Thought Content: Logical   Suicidal Thoughts:  No  Homicidal Thoughts:  No  Memory:  Immediate;   Fair Recent;   Fair Remote;   Fair  Judgement:  Fair  Insight:  Fair  Psychomotor Activity:  Normal  Concentration:  Concentration: Fair and Attention Span: Fair  Recall:  FiservFair  Fund of Knowledge: Fair  Language: Fair  Akathisia:  No  Handed:  Right  AIMS (if indicated): denies tremors, rigidity,stiffness  Assets:  Communication Skills Desire for Improvement Social Support  ADL's:  Intact  Cognition: WNL  Sleep:  improved   Screenings: GAD-7     [facility] Visit [facility]/05/2018 in [facility]  Total GAD-7 Score  5    PHQ2-9     [facility]/2016 in [facility]  PHQ-2 Total Score  1  2  1   PHQ-9 Total Score  8  7  -       Assessment and Plan: Elease Hashimotoatricia is a 42 year old Caucasian female, single, employed, lives in [place] has a history of depression, anxiety, PCO S, presented to the clinic today for a follow-up visit.  Patient reports she is currently doing well on the current medication regimen and denies any significant mood symptoms.  She will continue psychotherapy sessions.  Plan as noted below.  Plan For MDD PHQ 9 equals 2 Continue Zoloft 100 mg p.o. daily BuSpar 10 mg p.o. twice daily Continue psychotherapy with Ms.  Tasia Catchingsraig.  For GAD Continue Zoloft and BuSpar as prescribed  For insomnia Provided sleep hygiene techniques.  She will continue to use melatonin.  Follow-up in clinic in 2 months or sooner if needed  More than 50 % of the time was spent for psychoeducation and supportive psychotherapy and care coordination.  This note was generated in part or whole with voice recognition software. Voice recognition is usually quite accurate but there are transcription errors that can and very often do occur. I apologize for any typographical errors that were not detected and corrected.       Jomarie LongsSaramma Marjo Grosvenor, MD 10/22/2018, 5:36 PM

## 2018-10-25 ENCOUNTER — Other Ambulatory Visit: Payer: Self-pay | Admitting: Psychiatry

## 2018-10-25 DIAGNOSIS — F331 Major depressive disorder, recurrent, moderate: Secondary | ICD-10-CM

## 2018-10-25 DIAGNOSIS — F411 Generalized anxiety disorder: Secondary | ICD-10-CM

## 2018-10-28 ENCOUNTER — Ambulatory Visit
Admission: RE | Admit: 2018-10-28 | Discharge: 2018-10-28 | Disposition: A | Discharge status: Home / Self Care | Payer: BC Managed Care – PPO | Source: Ambulatory Visit | Attending: Physician Assistant | Admitting: Physician Assistant

## 2018-10-28 DIAGNOSIS — Z1231 Encounter for screening mammogram for malignant neoplasm of breast: Secondary | ICD-10-CM

## 2018-10-29 ENCOUNTER — Telehealth: Payer: Self-pay

## 2018-10-29 NOTE — Telephone Encounter (Signed)
LMTCB

## 2018-10-29 NOTE — Telephone Encounter (Signed)
-----   Message from Margaretann LovelessJennifer M Burnette, [place] sent at 10/29/2018  8:33 AM EST ----- Normal mammogram. Repeat screening in one year.

## 2018-10-29 NOTE — Telephone Encounter (Signed)
Pt advised.   Thanks,   -Alexis Miles  

## 2018-12-31 ENCOUNTER — Ambulatory Visit: Payer: BC Managed Care – PPO | Admitting: Psychiatry

## 2019-01-21 ENCOUNTER — Telehealth: Payer: Self-pay | Admitting: Physician Assistant

## 2019-01-21 NOTE — Telephone Encounter (Signed)
Pt was leaving message then said we were calling her to let her go.Alexis Miles

## 2019-01-23 ENCOUNTER — Encounter: Payer: Self-pay | Admitting: Physician Assistant

## 2019-01-23 ENCOUNTER — Other Ambulatory Visit: Payer: Self-pay | Admitting: Psychiatry

## 2019-01-23 ENCOUNTER — Ambulatory Visit: Payer: BC Managed Care – PPO | Admitting: Physician Assistant

## 2019-01-23 ENCOUNTER — Other Ambulatory Visit: Payer: Self-pay | Admitting: Physician Assistant

## 2019-01-23 VITALS — BP 130/70 | HR 76 | Temp 98.6°F | Resp 16 | Wt 342.6 lb

## 2019-01-23 DIAGNOSIS — N3000 Acute cystitis without hematuria: Secondary | ICD-10-CM

## 2019-01-23 DIAGNOSIS — Z807 Family history of other malignant neoplasms of lymphoid, hematopoietic and related tissues: Secondary | ICD-10-CM | POA: Diagnosis not present

## 2019-01-23 DIAGNOSIS — R350 Frequency of micturition: Secondary | ICD-10-CM

## 2019-01-23 DIAGNOSIS — R509 Fever, unspecified: Secondary | ICD-10-CM | POA: Diagnosis not present

## 2019-01-23 DIAGNOSIS — F331 Major depressive disorder, recurrent, moderate: Secondary | ICD-10-CM

## 2019-01-23 DIAGNOSIS — R1084 Generalized abdominal pain: Secondary | ICD-10-CM

## 2019-01-23 DIAGNOSIS — E282 Polycystic ovarian syndrome: Secondary | ICD-10-CM

## 2019-01-23 DIAGNOSIS — R7303 Prediabetes: Secondary | ICD-10-CM

## 2019-01-23 DIAGNOSIS — F330 Major depressive disorder, recurrent, mild: Secondary | ICD-10-CM

## 2019-01-23 DIAGNOSIS — F411 Generalized anxiety disorder: Secondary | ICD-10-CM

## 2019-01-23 LAB — POCT URINALYSIS DIPSTICK
BILIRUBIN UA: NEGATIVE
GLUCOSE UA: NEGATIVE
Ketones, UA: NEGATIVE
Nitrite, UA: NEGATIVE
PH UA: 6 (ref 5.0–8.0)
Protein, UA: POSITIVE — AB
Spec Grav, UA: 1.02 (ref 1.010–1.025)
UROBILINOGEN UA: 0.2 U/dL

## 2019-01-23 MED ORDER — SULFAMETHOXAZOLE-TRIMETHOPRIM 800-160 MG PO TABS: 1.0000 | ORAL_TABLET | Freq: Two times a day (BID) | ORAL | 0 refills | Status: DC

## 2019-01-23 NOTE — Patient Instructions (Signed)
Fever, Adult     A fever is an increase in the body's temperature. It is usually defined as a temperature of 100.78F (38C) or higher. Brief mild or moderate fevers generally have no long-term effects, and they often do not need treatment. Moderate or high fevers may make you feel uncomfortable and can sometimes be a sign of a serious illness or disease. The sweating that may occur with repeated or prolonged fever may also cause a loss of fluid in the body (dehydration). Fever is confirmed by taking a temperature with a thermometer. A measured temperature can vary with:  Age.  Time of day.  Where in the body you take the temperature. Readings may vary if you place the thermometer: ? In the mouth (oral). ? In the rectum (rectal). ? In the ear (tympanic). ? Under the arm (axillary). ? On the forehead (temporal). Follow these instructions at home: Medicines  Take over-the counter and prescription medicines only as told by your health care provider. Follow the dosing instructions carefully.  If you were prescribed an antibiotic medicine, take it as told by your health care provider. Do not stop taking the antibiotic even if you start to feel better. General instructions  Watch your condition for any changes. Let your health care provider know about them.  Rest as needed.  Drink enough fluid to keep your urine pale yellow. This helps to prevent dehydration.  Sponge yourself or bathe with room-temperature water to help reduce your body temperature as needed. Do not use ice water.  Do not use too many blankets or wear clothes that are too heavy.  If your fever may be caused by an infection that spreads from person to person (is contagious), such as a cold or the flu, you should stay home from work and public gatherings for at least 24 hours after your fever is gone. Your fever should be gone without the need to use medicines. Contact a health care provider if:  You vomit.  You cannot  eat or drink without vomiting.  You have diarrhea.  You have pain when you urinate.  Your symptoms do not improve with treatment.  You develop new symptoms.  You develop excessive weakness. Get help right away if:  You have shortness of breath or have trouble breathing.  You are dizzy or you faint.  You are disoriented or confused.  You develop signs of dehydration, such as: ? Dark urine, very little urine, or no urine. ? Cracked lips. ? Dry mouth. ? Sunken eyes. ? Sleepiness. ? Weakness.  You develop severe pain in your abdomen.  You have persistent vomiting or diarrhea.  You develop a skin rash.  Your symptoms suddenly get worse. Summary  A fever is an increase in the body's temperature. It is usually defined as a temperature of 100.78F (38C) or higher. Moderate or high fevers can sometimes be a sign of a serious illness or disease. The sweating that may occur with repeated or prolonged fever may also cause dehydration.  Pay attention to any changes in your symptoms and contact your health care provider if your symptoms do not improve with treatment.  Take over-the counter and prescription medicines only as told by your health care provider. Follow the dosing instructions carefully.  If your fever is from an infection that may be contagious, such as cold or flu, you should stay home from work and public gatherings for at least 24 hours after your fever is gone. Your fever should be gone  without the need to use medicines.  Get help right away if you develop signs of dehydration, such as dark urine, cracked lips, dry mouth, sunken eyes, sleepiness, or weakness. This information is not intended to replace advice given to you by your health care provider. Make sure you discuss any questions you have with your health care provider. Document Released: 05/22/2001 Document Revised: 05/12/2018 Document Reviewed: 05/12/2018 Elsevier Interactive Patient Education  2019  Elsevier Inc. Abdominal Pain, Adult  Many things can cause belly (abdominal) pain. Most times, belly pain is not dangerous. Many cases of belly pain can be watched and treated at home. Sometimes belly pain is serious, though. Your doctor will try to find the cause of your belly pain. Follow these instructions at home:  Take over-the-counter and prescription medicines only as told by your doctor. Do not take medicines that help you poop (laxatives) unless told to by your doctor.  Drink enough fluid to keep your pee (urine) clear or pale yellow.  Watch your belly pain for any changes.  Keep all follow-up visits as told by your doctor. This is important. Contact a doctor if:  Your belly pain changes or gets worse.  You are not hungry, or you lose weight without trying.  You are having trouble pooping (constipated) or have watery poop (diarrhea) for more than 2-3 days.  You have pain when you pee or poop.  Your belly pain wakes you up at night.  Your pain gets worse with meals, after eating, or with certain foods.  You are throwing up and cannot keep anything down.  You have a fever. Get help right away if:  Your pain does not go away as soon as your doctor says it should.  You cannot stop throwing up.  Your pain is only in areas of your belly, such as the right side or the left lower part of the belly.  You have bloody or black poop, or poop that looks like tar.  You have very bad pain, cramping, or bloating in your belly.  You have signs of not having enough fluid or water in your body (dehydration), such as: ? Dark pee, very little pee, or no pee. ? Cracked lips. ? Dry mouth. ? Sunken eyes. ? Sleepiness. ? Weakness. This information is not intended to replace advice given to you by your health care provider. Make sure you discuss any questions you have with your health care provider. Document Released: 05/14/2008 Document Revised: 06/15/2016 Document Reviewed:  05/09/2016 Elsevier Interactive Patient Education  2019 [employer].

## 2019-01-23 NOTE — Progress Notes (Signed)
Patient: Alexis Miles Female    DOB: 01-01-1976   43 y.o.   MRN: [id number] Visit Date: 01/23/2019  Today's Provider: Margaretann Loveless, PA-C   No chief complaint on file.  Subjective:     HPI  Patient here today with c/o fever. Patient reports that the week before Christmas she reports that Dec 22,2019 she woke up with a 102 fever. Reports that on Christmas eve she had a 104.2 temp. Reports that she went to an urgent care and was treated for flu and nausea. She reports that on [employer] weekend she developed another fever and she reports that she got treated with Tamiflu again. Now she reports that this past Saturday she has been having fevers and that Saturday she reports that she had a sharp pain in her pelvic area and radiated to her left lower abdomen to her back. She reports that she has been feeling nauseated. Last fever that patient had was on Wednesday of this week, she did not check her temperature but she reports that did have one because she felt warm. Patient reports that her mother died from Lymphoma.  Patient reports that she never started the Metformin for PCOS.   Allergies  Allergen Reactions  . Penicillins Other (See Comments)    Has patient had a PCN reaction causing immediate rash, facial/tongue/throat swelling, SOB or lightheadedness with hypotension: Unknown Has patient had a PCN reaction causing severe rash involving mucus membranes or skin necrosis: Unknown Has patient had a PCN reaction that required hospitalization: Unknown Has patient had a PCN reaction occurring within the last 10 years: Unknown If all of the above answers are "NO", then may proceed with Cephalosporin use.   . Trazodone And Nefazodone Other (See Comments)    Suicidal Ideation     Current Outpatient Medications:  .  acetaminophen (TYLENOL) 500 MG tablet, Take 500 mg by mouth every 6 (six) hours as needed for mild pain or headache. , Disp: , Rfl:  .  busPIRone  (BUSPAR) 10 MG tablet, Take 1 tablet (10 mg total) by mouth 2 (two) times daily., Disp: 180 tablet, Rfl: 0 .  sertraline (ZOLOFT) 100 MG tablet, Take 1 tablet (100 mg total) by mouth daily., Disp: 90 tablet, Rfl: 1 .  Vitamin D, Ergocalciferol, (DRISDOL) 50000 units CAPS capsule, Take 1 capsule (50,000 Units total) by mouth every 7 (seven) days., Disp: 12 capsule, Rfl: 1 .  metFORMIN (GLUCOPHAGE-XR) 500 MG 24 hr tablet, Take 1 tablet (500 mg total) by mouth daily with breakfast., Disp: 90 tablet, Rfl: 1  Review of Systems  Constitutional: Positive for fatigue and fever.  HENT: Negative.   Respiratory: Negative.   Cardiovascular: Negative.   Gastrointestinal: Positive for abdominal pain. Negative for constipation, diarrhea, nausea and vomiting.  Genitourinary: Positive for dysuria and frequency. Negative for hematuria, urgency, vaginal discharge and vaginal pain.  Musculoskeletal: Positive for back pain.    Social History   Tobacco Use  . Smoking status: Never Smoker  . Smokeless tobacco: Never Used  Substance Use Topics  . Alcohol use: Not Currently    Comment: Rare      Objective:   BP 130/70 (BP Location: Left Wrist, Patient Position: Sitting, Cuff Size: Normal)   Pulse 76   Temp 98.6 F (37 C) (Oral)   Resp 16   Wt (!) 342 lb 9.6 oz (155.4 kg)   SpO2 98%   BMI 57.01 kg/m  Vitals:   01/23/19  0702  BP: 130/70  Pulse: 76  Resp: 16  Temp: 98.6 F (37 C)  TempSrc: Oral  SpO2: 98%  Weight: (!) 342 lb 9.6 oz (155.4 kg)     Physical Exam Vitals signs reviewed.  Constitutional:      General: She is not in acute distress.    Appearance: Normal appearance. She is well-developed. She is obese. She is not ill-appearing or diaphoretic.  Cardiovascular:     Rate and Rhythm: Normal rate and regular rhythm.     Heart sounds: Normal heart sounds. No murmur. No friction rub. No gallop.   Pulmonary:     Effort: Pulmonary effort is normal. No respiratory distress.     Breath  sounds: Normal breath sounds. No wheezing or rales.  Abdominal:     General: Bowel sounds are normal. There is no distension.     Palpations: Abdomen is soft. There is no mass.     Tenderness: There is abdominal tenderness in the suprapubic area and left lower quadrant. There is no guarding or rebound.  Skin:    General: Skin is warm and dry.  Neurological:     Mental Status: She is alert and oriented to person, place, and time.         Assessment & Plan    1. Fever, unspecified fever cause Will check labs as below and f/u pending results. - CBC w/Diff/Platelet - Comprehensive Metabolic Panel (CMET) - TSH  2. Generalized abdominal pain Will check labs as below and f/u pending results. - CBC w/Diff/Platelet - Comprehensive Metabolic Panel (CMET) - TSH  3. Family history of lymphoma Will check labs as below and f/u pending results. - CBC w/Diff/Platelet - Comprehensive Metabolic Panel (CMET) - TSH  4. Urine frequency Patient could not urinate initially, but was given a sterile cup to return once she was able to obtain a urine specimen. Once tested UA was positive. Will treat empirically with Bactrim as below. Continue to push fluids. Urine sent for culture. Will follow up pending C&S results. She is to call if symptoms do not improve or if they worsen.  - POCT Urinalysis Dipstick  5. Acute cystitis without hematuria See above medical treatment plan. - sulfamethoxazole-trimethoprim (BACTRIM DS,SEPTRA DS) 800-160 MG tablet; Take 1 tablet by mouth 2 (two) times daily.  Dispense: 14 tablet; Refill: 0 - Urine Culture     Margaretann Loveless, PA-C  [facility]

## 2019-01-24 LAB — CBC WITH DIFFERENTIAL/PLATELET
BASOS: 1 %
Basophils Absolute: 0.1 10*3/uL (ref 0.0–0.2)
EOS (ABSOLUTE): 0.2 10*3/uL (ref 0.0–0.4)
EOS: 2 %
HEMATOCRIT: 34.5 % (ref 34.0–46.6)
HEMOGLOBIN: 11.3 g/dL (ref 11.1–15.9)
IMMATURE GRANS (ABS): 0.4 10*3/uL — AB (ref 0.0–0.1)
Immature Granulocytes: 4 %
LYMPHS: 20 %
Lymphocytes Absolute: 1.9 10*3/uL (ref 0.7–3.1)
MCH: 26.6 pg (ref 26.6–33.0)
MCHC: 32.8 g/dL (ref 31.5–35.7)
MCV: 81 fL (ref 79–97)
MONOCYTES: 11 %
MONOS ABS: 1.1 10*3/uL — AB (ref 0.1–0.9)
Neutrophils Absolute: 6 10*3/uL (ref 1.4–7.0)
Neutrophils: 62 %
Platelets: 227 10*3/uL (ref 150–450)
RBC: 4.25 x10E6/uL (ref 3.77–5.28)
RDW: 15.6 % — AB (ref 11.7–15.4)
WBC: 9.6 10*3/uL (ref 3.4–10.8)

## 2019-01-24 LAB — COMPREHENSIVE METABOLIC PANEL
A/G RATIO: 1.2 (ref 1.2–2.2)
ALT: 36 IU/L — AB (ref 0–32)
AST: 19 IU/L (ref 0–40)
Albumin: 3.4 g/dL — ABNORMAL LOW (ref 3.8–4.8)
Alkaline Phosphatase: 89 IU/L (ref 39–117)
BUN/Creatinine Ratio: 14 (ref 9–23)
BUN: 18 mg/dL (ref 6–24)
Bilirubin Total: 0.5 mg/dL (ref 0.0–1.2)
CALCIUM: 9 mg/dL (ref 8.7–10.2)
CO2: 25 mmol/L (ref 20–29)
CREATININE: 1.28 mg/dL — AB (ref 0.57–1.00)
Chloride: 102 mmol/L (ref 96–106)
GFR, EST AFRICAN AMERICAN: 60 mL/min/{1.73_m2} (ref >59)
GFR, EST NON AFRICAN AMERICAN: 52 mL/min/{1.73_m2} — AB (ref >59)
GLOBULIN, TOTAL: 2.9 g/dL (ref 1.5–4.5)
Glucose: 82 mg/dL (ref 65–99)
Potassium: 3.8 mmol/L (ref 3.5–5.2)
SODIUM: 141 mmol/L (ref 134–144)
TOTAL PROTEIN: 6.3 g/dL (ref 6.0–8.5)

## 2019-01-24 LAB — TSH: TSH: 3.65 u[IU]/mL (ref 0.450–4.500)

## 2019-01-25 LAB — URINE CULTURE

## 2019-01-26 ENCOUNTER — Telehealth: Payer: Self-pay

## 2019-01-26 NOTE — Telephone Encounter (Signed)
Patient was advised.  

## 2019-01-26 NOTE — Telephone Encounter (Signed)
-----   Message from Margaretann Loveless, [place] sent at 01/26/2019  7:34 AM EST ----- Urine culture was positive for klebsiella bacteria. It is susceptible to bactrim which is the antibiotic you are on. Continue until completed.

## 2019-01-30 ENCOUNTER — Telehealth: Payer: Self-pay

## 2019-01-30 DIAGNOSIS — R7989 Other specified abnormal findings of blood chemistry: Secondary | ICD-10-CM

## 2019-01-30 DIAGNOSIS — R748 Abnormal levels of other serum enzymes: Secondary | ICD-10-CM

## 2019-01-30 NOTE — Telephone Encounter (Signed)
Labs were unremarkable except a bump in kidney function which could be from the UTI as well as dehydration. Push fluids and make sure to stay hydrated. We can recheck in 2-4 weeks to make sure returning to normal.

## 2019-01-30 NOTE — Addendum Note (Signed)
Addended by: Margaretann Loveless on: 01/30/2019 04:07 PM   Modules accepted: Orders

## 2019-01-30 NOTE — Telephone Encounter (Signed)
Patient advised as directed below. Lab requisition placed up front. 

## 2019-01-30 NOTE — Telephone Encounter (Signed)
Pt called requesting results from labs if [facility] has seen anything that was abnormal.  Please call pt back to advise.  Call back # [phone]

## 2019-04-01 ENCOUNTER — Encounter: Payer: Self-pay | Admitting: Physician Assistant

## 2019-04-01 ENCOUNTER — Ambulatory Visit (INDEPENDENT_AMBULATORY_CARE_PROVIDER_SITE_OTHER): Payer: BC Managed Care – PPO | Admitting: Physician Assistant

## 2019-04-01 DIAGNOSIS — N3000 Acute cystitis without hematuria: Secondary | ICD-10-CM

## 2019-04-01 MED ORDER — SULFAMETHOXAZOLE-TRIMETHOPRIM 800-160 MG PO TABS: 1.0000 | ORAL_TABLET | Freq: Two times a day (BID) | ORAL | 0 refills | Status: DC

## 2019-04-01 NOTE — Progress Notes (Signed)
Virtual Visit via Video Note  I connected with Alexis Miles on 04/01/19 at  1:40 PM EDT by a video enabled telemedicine application and verified that I am speaking with the correct person using two identifiers.   I discussed the limitations of evaluation and management by telemedicine and the availability of in person appointments. The patient expressed understanding and agreed to proceed.  Patient location: home [facility] Persons involved in the visit: patient, provider   Margaretann LovelessJennifer M Clarice Zulauf, PA-C   Patient: Alexis Miles L Miles Female    DOB: September 23, 1976   43 y.o.   MRN: [id number] Visit Date: 04/01/2019  Today's Provider: Margaretann LovelessJennifer M Negar Sieler, PA-C   No chief complaint on file.  Subjective:     HPI Alexis Miles is a 43 yr old female that presents today via e-visit for urinary frequency, pelvic pain, fever and chills. She reports the symptoms started with pelvic pain 2-3 days ago. She felt she may be getting ready to start her menstrual cycle. Then the urine frequency started yesterday and the fever came last night and has been consistent through the day. She reports this feels similar to her pyelonephritis she had in February, but not as painful and the pain isn't as high in the back as it was then.  Allergies  Allergen Reactions  . Penicillins Other (See Comments)    Has patient had a PCN reaction causing immediate rash, facial/tongue/throat swelling, SOB or lightheadedness with hypotension: Unknown Has patient had a PCN reaction causing severe rash involving mucus membranes or skin necrosis: Unknown Has patient had a PCN reaction that required hospitalization: Unknown Has patient had a PCN reaction occurring within the last 10 years: Unknown If all of the above answers are "NO", then may proceed with Cephalosporin use.   . Trazodone And Nefazodone Other (See Comments)    Suicidal Ideation     Current Outpatient Medications:  .   acetaminophen (TYLENOL) 500 MG tablet, Take 500 mg by mouth every 6 (six) hours as needed for mild pain or headache. , Disp: , Rfl:  .  busPIRone (BUSPAR) 10 MG tablet, TAKE 1 TABLET(10 MG) BY MOUTH TWICE DAILY, Disp: 180 tablet, Rfl: 0 .  escitalopram (LEXAPRO) 20 MG tablet, TAKE 1 TABLET(20 MG) BY MOUTH AT BEDTIME, Disp: 90 tablet, Rfl: 1 .  metFORMIN (GLUCOPHAGE-XR) 500 MG 24 hr tablet, TAKE 1 TABLET(500 MG) BY MOUTH DAILY WITH BREAKFAST, Disp: 90 tablet, Rfl: 1 .  sertraline (ZOLOFT) 100 MG tablet, TAKE 1 TABLET(100 MG) BY MOUTH DAILY, Disp: 90 tablet, Rfl: 1 .  sulfamethoxazole-trimethoprim (BACTRIM DS,SEPTRA DS) 800-160 MG tablet, Take 1 tablet by mouth 2 (two) times daily., Disp: 14 tablet, Rfl: 0 .  Vitamin D, Ergocalciferol, (DRISDOL) 50000 units CAPS capsule, Take 1 capsule (50,000 Units total) by mouth every 7 (seven) days., Disp: 12 capsule, Rfl: 1  Review of Systems  Constitutional: Positive for chills and fever. Negative for appetite change and fatigue.  Respiratory: Negative for chest tightness and shortness of breath.   Cardiovascular: Negative for chest pain and palpitations.  Gastrointestinal: Negative for abdominal pain, nausea and vomiting.  Genitourinary: Positive for dysuria, frequency and urgency. Negative for flank pain.  Neurological: Positive for headaches. Negative for dizziness and weakness.    Social History   Tobacco Use  . Smoking status: Never Smoker  . Smokeless tobacco: Never Used  Substance Use Topics  . Alcohol use: Not Currently    Comment: Rare  Objective:   There were no vitals taken for this visit. There were no vitals filed for this visit.   Physical Exam Vitals signs reviewed.  Constitutional:      Appearance: Normal appearance. She is well-developed. She is obese.  HENT:     Head: Normocephalic and atraumatic.  Neck:     Musculoskeletal: Normal range of motion and neck supple.  Pulmonary:     Effort: Pulmonary effort is normal.  No respiratory distress.  Neurological:     Mental Status: She is alert.  Psychiatric:        Mood and Affect: Mood normal.        Behavior: Behavior normal.        Thought Content: Thought content normal.        Judgment: Judgment normal.       Assessment & Plan    1. Acute cystitis without hematuria Worsening symptoms. UA positive. Will treat empirically with Bactrim as below. May use AZO or cranberry juice for spasm and pain. Continue to push fluids. Urine sent for culture. Will follow up pending C&S results. She is to call if symptoms do not improve or if they worsen.  - sulfamethoxazole-trimethoprim (BACTRIM DS) 800-160 MG tablet; Take 1 tablet by mouth 2 (two) times daily.  Dispense: 20 tablet; Refill: 0     Margaretann Loveless, PA-C  [facility]

## 2019-04-23 ENCOUNTER — Other Ambulatory Visit: Payer: Self-pay | Admitting: Psychiatry

## 2019-04-23 DIAGNOSIS — F331 Major depressive disorder, recurrent, moderate: Secondary | ICD-10-CM

## 2019-04-23 DIAGNOSIS — F411 Generalized anxiety disorder: Secondary | ICD-10-CM

## 2019-12-21 IMAGING — MG DIGITAL SCREENING BILATERAL MAMMOGRAM WITH TOMO AND CAD
8 series · 8 of 24 positions shown · non-contrast
Comparison: Previous exam(s).

CLINICAL DATA: Screening.

EXAM:
DIGITAL SCREENING BILATERAL MAMMOGRAM WITH TOMO AND CAD

[R CC synth-2D]
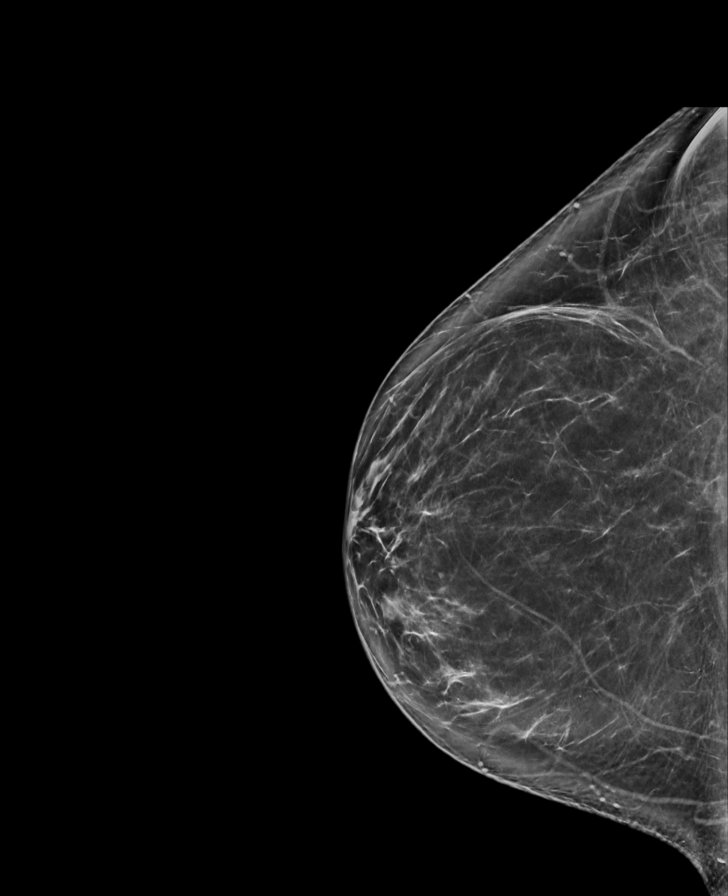

[L CC synth-2D]
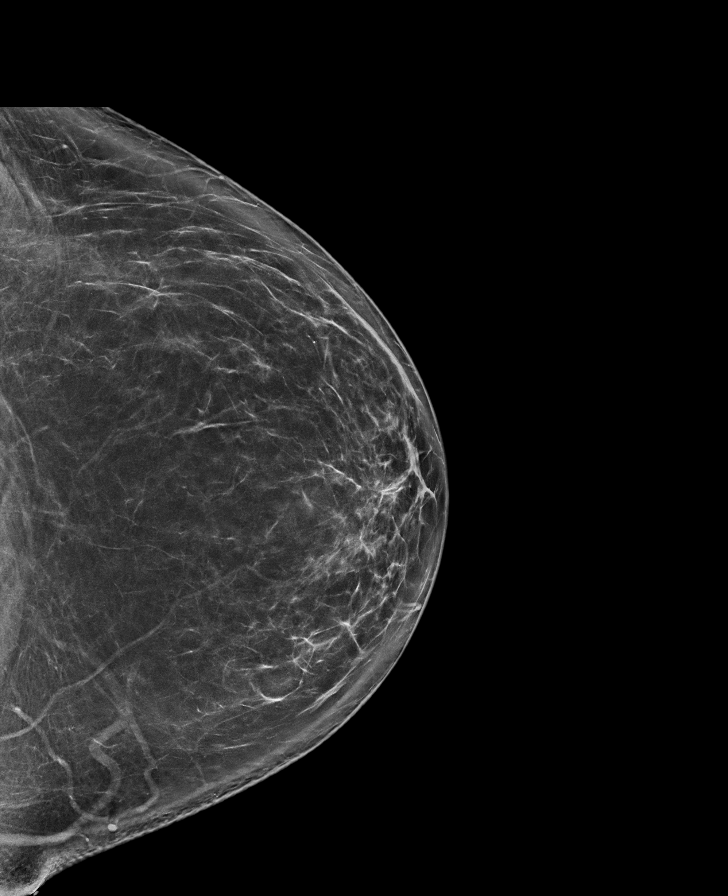

[R MLO synth-2D]
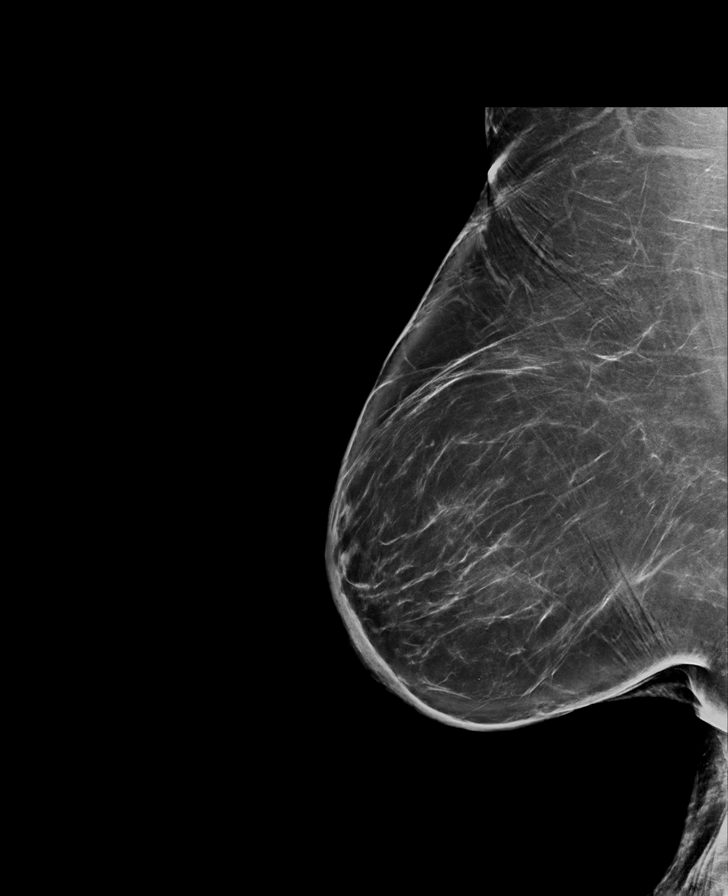

[L MLO synth-2D]
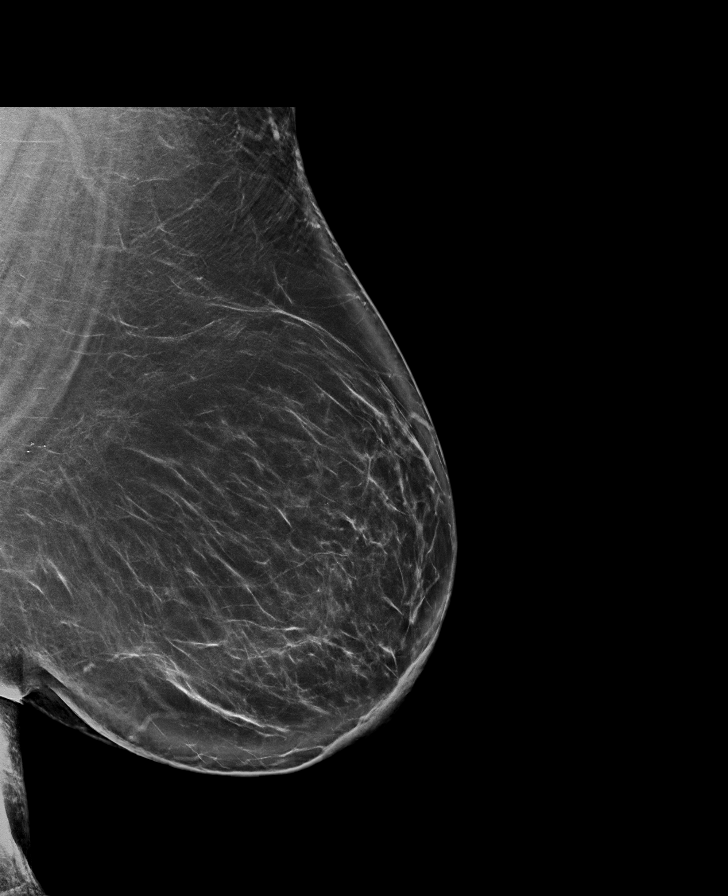

[R CC tomo · tomo slice 41/80.0]
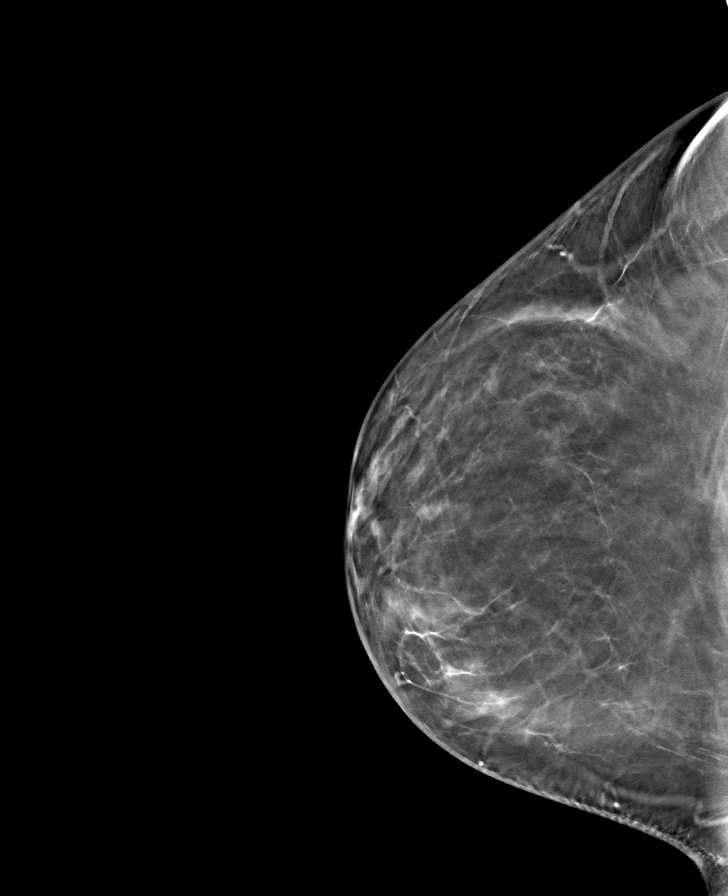

[L CC tomo · tomo slice 43/86.0]
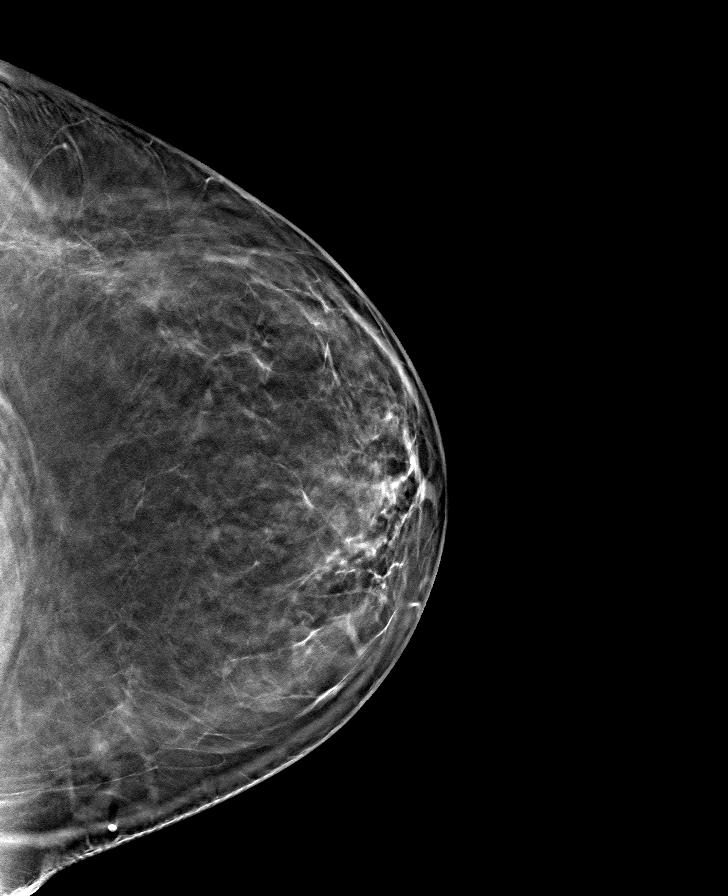

[L MLO tomo · tomo slice 51/101.0]
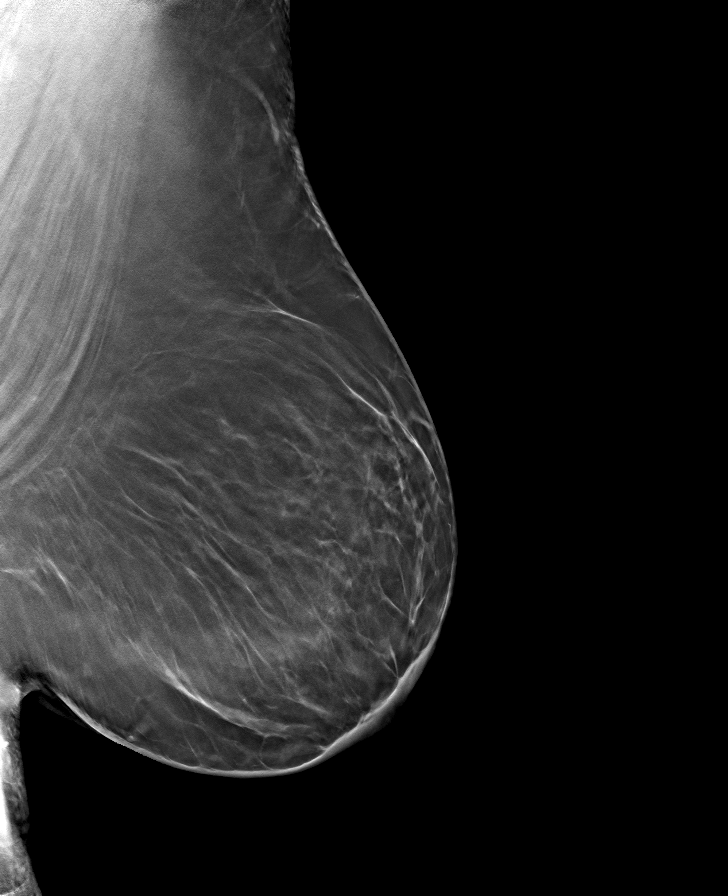

[R MLO tomo · tomo slice 51/100.0]
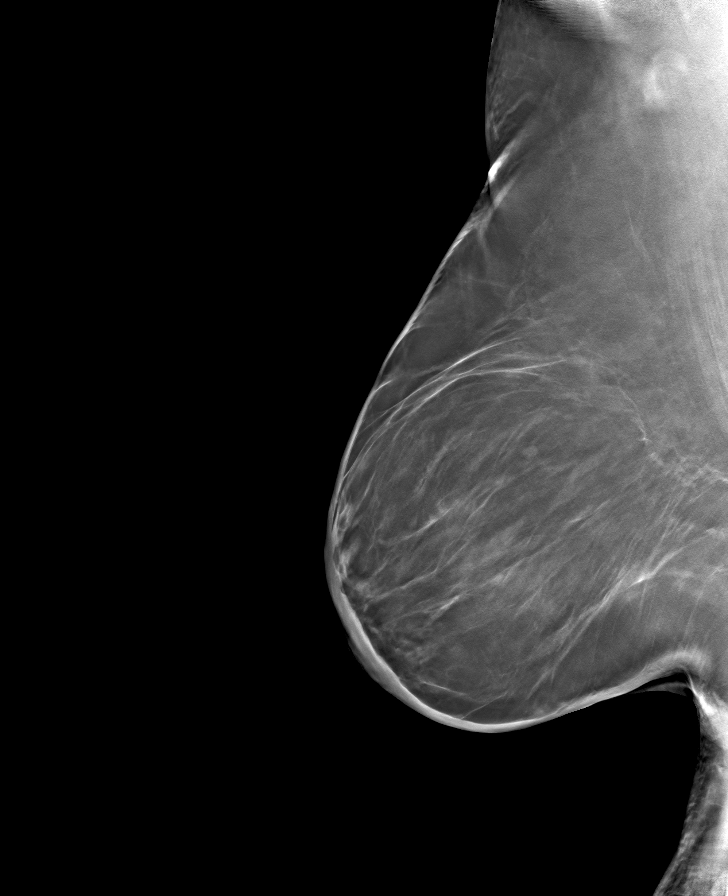

[8 of 24 positions shown; findings below may reference images not displayed]

ACR Breast Density Category b: There are scattered areas of
fibroglandular density.
FINDINGS: There are no findings suspicious for malignancy. Images were
processed with CAD.
IMPRESSION: No mammographic evidence of malignancy. A result letter of this
screening mammogram will be mailed directly to the patient.

RECOMMENDATION:
Screening mammogram in one year. (Code:[id number])

BI-RADS CATEGORY  1: Negative.

## 2020-02-06 ENCOUNTER — Ambulatory Visit: Payer: BC Managed Care – PPO

## 2020-02-06 ENCOUNTER — Ambulatory Visit: Payer: BC Managed Care – PPO | Attending: Internal Medicine

## 2020-02-06 DIAGNOSIS — Z23 Encounter for immunization: Secondary | ICD-10-CM | POA: Insufficient documentation

## 2020-02-06 NOTE — Progress Notes (Signed)
   Covid-19 Vaccination Clinic  Name:  Alexis Miles    MRN: [id number] DOB: [birth year]  02/06/2020  Ms. Brummet was observed post Covid-19 immunization for 15 minutes without incidence. She was provided with Vaccine Information Sheet and instruction to access the V-Safe system.   Ms. Christian was instructed to call 911 with any severe reactions post vaccine: Marland Kitchen Difficulty breathing  . Swelling of your face and throat  . A fast heartbeat  . A bad rash all over your body  . Dizziness and weakness    Immunizations Administered    Name Date Dose VIS Date Route   Moderna COVID-19 Vaccine 02/06/2020  2:53 PM 0.5 mL 11/10/2019 Intramuscular   Manufacturer: Moderna   Lot: [id number]   NDC: 42552-589-48

## 2020-03-05 ENCOUNTER — Ambulatory Visit: Payer: BC Managed Care – PPO | Attending: Internal Medicine

## 2020-03-05 DIAGNOSIS — Z23 Encounter for immunization: Secondary | ICD-10-CM

## 2020-03-05 NOTE — Progress Notes (Signed)
   Covid-19 Vaccination Clinic  Name:  MISTEY HOFFERT    MRN: [id number] DOB: Nov 25, 1976  03/05/2020  Ms. Goodell was observed post Covid-19 immunization for 15 minutes without incident. She was provided with Vaccine Information Sheet and instruction to access the V-Safe system.   Ms. Beneke was instructed to call 911 with any severe reactions post vaccine: Marland Kitchen Difficulty breathing  . Swelling of face and throat  . A fast heartbeat  . A bad rash all over body  . Dizziness and weakness   Immunizations Administered    Name Date Dose VIS Date Route   Moderna COVID-19 Vaccine 03/05/2020  2:09 PM 0.5 mL 11/10/2019 Intramuscular   Manufacturer: Gala Murdoch   Lot: [id number]   NDC: 19941-290-47

## 2020-06-24 ENCOUNTER — Ambulatory Visit (INDEPENDENT_AMBULATORY_CARE_PROVIDER_SITE_OTHER): Payer: BC Managed Care – PPO | Admitting: Physician Assistant

## 2020-06-24 ENCOUNTER — Other Ambulatory Visit: Payer: Self-pay

## 2020-06-24 ENCOUNTER — Other Ambulatory Visit (HOSPITAL_COMMUNITY)
Admission: RE | Admit: 2020-06-24 | Discharge: 2020-06-24 | Disposition: A | Discharge status: Home / Self Care | Payer: BC Managed Care – PPO | Source: Ambulatory Visit | Attending: Physician Assistant | Admitting: Physician Assistant

## 2020-06-24 ENCOUNTER — Encounter: Payer: Self-pay | Admitting: Physician Assistant

## 2020-06-24 VITALS — BP 122/87 | HR 82 | Temp 97.3°F | Resp 16 | Ht 65.5 in | Wt 345.0 lb

## 2020-06-24 DIAGNOSIS — R739 Hyperglycemia, unspecified: Secondary | ICD-10-CM

## 2020-06-24 DIAGNOSIS — E559 Vitamin D deficiency, unspecified: Secondary | ICD-10-CM

## 2020-06-24 DIAGNOSIS — F411 Generalized anxiety disorder: Secondary | ICD-10-CM | POA: Diagnosis not present

## 2020-06-24 DIAGNOSIS — Z803 Family history of malignant neoplasm of breast: Secondary | ICD-10-CM

## 2020-06-24 DIAGNOSIS — Z1239 Encounter for other screening for malignant neoplasm of breast: Secondary | ICD-10-CM

## 2020-06-24 DIAGNOSIS — E789 Disorder of lipoprotein metabolism, unspecified: Secondary | ICD-10-CM

## 2020-06-24 DIAGNOSIS — Z6841 Body Mass Index (BMI) 40.0 and over, adult: Secondary | ICD-10-CM

## 2020-06-24 DIAGNOSIS — F331 Major depressive disorder, recurrent, moderate: Secondary | ICD-10-CM | POA: Diagnosis not present

## 2020-06-24 DIAGNOSIS — Z0000 Encounter for general adult medical examination without abnormal findings: Secondary | ICD-10-CM

## 2020-06-24 DIAGNOSIS — Z124 Encounter for screening for malignant neoplasm of cervix: Secondary | ICD-10-CM

## 2020-06-24 DIAGNOSIS — E282 Polycystic ovarian syndrome: Secondary | ICD-10-CM

## 2020-06-24 DIAGNOSIS — Z1159 Encounter for screening for other viral diseases: Secondary | ICD-10-CM

## 2020-06-24 MED ORDER — METFORMIN HCL 500 MG PO TABS: 500.0000 mg | ORAL_TABLET | Freq: Every day | ORAL | 3 refills | Status: DC

## 2020-06-24 MED ORDER — SERTRALINE HCL 100 MG PO TABS: ORAL_TABLET | ORAL | 3 refills | Status: DC

## 2020-06-24 MED ORDER — VITAMIN D (ERGOCALCIFEROL) 1.25 MG (50000 UNIT) PO CAPS: 50000.0000 [IU] | ORAL_CAPSULE | ORAL | 3 refills | Status: DC

## 2020-06-24 MED ORDER — BUSPIRONE HCL 10 MG PO TABS: ORAL_TABLET | ORAL | 3 refills | Status: DC

## 2020-06-24 NOTE — Patient Instructions (Signed)
Health Maintenance, Female Adopting a healthy lifestyle and getting preventive care are important in promoting health and wellness. Ask your health care provider about:  The right schedule for you to have regular tests and exams.  Things you can do on your own to prevent diseases and keep yourself healthy. What should I know about diet, weight, and exercise? Eat a healthy diet   Eat a diet that includes plenty of vegetables, fruits, low-fat dairy products, and lean protein.  Do not eat a lot of foods that are high in solid fats, added sugars, or sodium. Maintain a healthy weight Body mass index (BMI) is used to identify weight problems. It estimates body fat based on height and weight. Your health care provider can help determine your BMI and help you achieve or maintain a healthy weight. Get regular exercise Get regular exercise. This is one of the most important things you can do for your health. Most adults should:  Exercise for at least 150 minutes each week. The exercise should increase your heart rate and make you sweat (moderate-intensity exercise).  Do strengthening exercises at least twice a week. This is in addition to the moderate-intensity exercise.  Spend less time sitting. Even light physical activity can be beneficial. Watch cholesterol and blood lipids Have your blood tested for lipids and cholesterol at 42 years of age, then have this test every 5 years. Have your cholesterol levels checked more often if:  Your lipid or cholesterol levels are high.  You are older than 42 years of age.  You are at high risk for heart disease. What should I know about cancer screening? Depending on your health history and family history, you may need to have cancer screening at various ages. This may include screening for:  Breast cancer.  Cervical cancer.  Colorectal cancer.  Skin cancer.  Lung cancer. What should I know about heart disease, diabetes, and high blood  pressure? Blood pressure and heart disease  High blood pressure causes heart disease and increases the risk of stroke. This is more likely to develop in people who have high blood pressure readings, are of African descent, or are overweight.  Have your blood pressure checked: ? Every 3-5 years if you are 18-39 years of age. ? Every year if you are 40 years old or older. Diabetes Have regular diabetes screenings. This checks your fasting blood sugar level. Have the screening done:  Once every three years after age 40 if you are at a normal weight and have a low risk for diabetes.  More often and at a younger age if you are overweight or have a high risk for diabetes. What should I know about preventing infection? Hepatitis B If you have a higher risk for hepatitis B, you should be screened for this virus. Talk with your health care provider to find out if you are at risk for hepatitis B infection. Hepatitis C Testing is recommended for:  Everyone born from 1945 through 1965.  Anyone with known risk factors for hepatitis C. Sexually transmitted infections (STIs)  Get screened for STIs, including gonorrhea and chlamydia, if: ? You are sexually active and are younger than 42 years of age. ? You are older than 42 years of age and your health care provider tells you that you are at risk for this type of infection. ? Your sexual activity has changed since you were last screened, and you are at increased risk for chlamydia or gonorrhea. Ask your health care provider if   you are at risk.  Ask your health care provider about whether you are at high risk for HIV. Your health care provider may recommend a prescription medicine to help prevent HIV infection. If you choose to take medicine to prevent HIV, you should first get tested for HIV. You should then be tested every 3 months for as long as you are taking the medicine. Pregnancy  If you are about to stop having your period (premenopausal) and  you may become pregnant, seek counseling before you get pregnant.  Take 400 to 800 micrograms (mcg) of folic acid every day if you become pregnant.  Ask for birth control (contraception) if you want to prevent pregnancy. Osteoporosis and menopause Osteoporosis is a disease in which the bones lose minerals and strength with aging. This can result in bone fractures. If you are 65 years old or older, or if you are at risk for osteoporosis and fractures, ask your health care provider if you should:  Be screened for bone loss.  Take a calcium or vitamin D supplement to lower your risk of fractures.  Be given hormone replacement therapy (HRT) to treat symptoms of menopause. Follow these instructions at home: Lifestyle  Do not use any products that contain nicotine or tobacco, such as cigarettes, e-cigarettes, and chewing tobacco. If you need help quitting, ask your health care provider.  Do not use street drugs.  Do not share needles.  Ask your health care provider for help if you need support or information about quitting drugs. Alcohol use  Do not drink alcohol if: ? Your health care provider tells you not to drink. ? You are pregnant, may be pregnant, or are planning to become pregnant.  If you drink alcohol: ? Limit how much you use to 0-1 drink a day. ? Limit intake if you are breastfeeding.  Be aware of how much alcohol is in your drink. In the [place] one drink equals one 12 oz bottle of beer (355 mL), one 5 oz glass of wine (148 mL), or one 1 oz glass of hard liquor (44 mL). General instructions  Schedule regular health, dental, and eye exams.  Stay current with your vaccines.  Tell your health care provider if: ? You often feel depressed. ? You have ever been abused or do not feel safe at home. Summary  Adopting a healthy lifestyle and getting preventive care are important in promoting health and wellness.  Follow your health care provider's instructions about healthy  diet, exercising, and getting tested or screened for diseases.  Follow your health care provider's instructions on monitoring your cholesterol and blood pressure. This information is not intended to replace advice given to you by your health care provider. Make sure you discuss any questions you have with your health care provider. Document Revised: 11/19/2018 Document Reviewed: 11/19/2018 [employer]vier Patient Education  2020 [employer]  

## 2020-06-24 NOTE — Progress Notes (Signed)
Complete physical exam   Patient: Alexis Sawyersatricia L Salzman   DOB: 08/11/1976   44 y.o. Female  MRN: [id number] Visit Date: 06/24/2020  Today's healthcare provider: Margaretann LovelessJennifer M Mariely Mahr, PA-C   Chief Complaint  Patient presents with  . Annual Exam   Subjective    Alexis Miles is a 44 y.o. female who presents today for a complete physical exam.  She reports consuming a general diet. The patient does not participate in regular exercise at present. She generally feels fairly well. She reports sleeping poorly. She does have additional problems to discuss today.  HPI  Anxiety: Patient complains of anxiety disorder and sleep disturbance.  She has the following symptoms: difficulty concentrating, fatigue, feelings of losing control, irritable, racing thoughts, sweating. Onset of symptoms was approximately 2 weeks ago, gradually worsening since that time. She denies current suicidal and homicidal ideation. Family history significant for alcoholism, anxiety, depression and substance abuseand heart disease.Possible organic causes contributing are: work and being with out medicine. Previous treatment includes Lexapro, Prozac, Xanax and Zoloft   She complains of the following side effects from the treatment: therapeutic failure, trazodone and Xanax gave her suicidal ideation. She has been to the psychiatrist before but hasn't been to her psychiatrist since the pandemic and feels like that didn't helped. She would like a refill on her Buspirone 10mg .   Past Medical History:  Diagnosis Date  . Anxiety   . Diabetes mellitus, type II (HCC)   . PCOS (polycystic ovarian syndrome)    Past Surgical History:  Procedure Laterality Date  . BARTHOLIN GLAND CYST EXCISION    . MOUTH SURGERY     wisdom teeth removed   Social History   Socioeconomic History  . Marital status: Single    Spouse name: Not on file  . Number of children: 0  . Years of education: College  . Highest education level: Bachelor's  degree (e.g., BA, AB, BS)  Occupational History  . 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 Smoking status: Never Smoker  . Smokeless tobacco: Never Used  Vaping Use  . Vaping Use: Never used  Substance and Sexual Activity  . Alcohol use: Not Currently    Comment: Rare  . Drug use: No  . Sexual activity: Not Currently    Comment: Never sexually active  Other Topics Concern  . Not on file  Social History Narrative  . Not on file   Social Determinants of Health   Financial Resource Strain:   . Difficulty of Paying Living Expenses:   Food Insecurity:   . Worried About Programme researcher, broadcasting/film/videounning Out of Food in the Last Year:   . Baristaan Out of Food in the Last Year:   Transportation Needs:   . Freight forwarderLack of Transportation (Medical):   Marland Kitchen. Lack of Transportation (Non-Medical):   Physical Activity:   . Days of Exercise per Week:   . Minutes of Exercise per Session:   Stress:   . Feeling of Stress :   Social Connections:   . Frequency of Communication with Friends and Family:   . Frequency of Social Gatherings with Friends and Family:   . Attends Religious Services:   . Active Member of Clubs or Organizations:   . Attends BankerClub or Organization Meetings:   Marland Kitchen. Marital Status:   Intimate Partner Violence:   . Fear of Current or Ex-Partner:   . Emotionally Abused:   .  Physically Abused:   . Sexually Abused:    Family Status  Relation Name Status  . Mother  Deceased  . Father  Alive  . Sister  Alive  . MGM  Deceased  . PGF  Deceased       MI  . Sister  Alive  . Mat Aunt  (Not Specified)   Family History  Problem Relation Age of Onset  . Arthritis Mother   . Lymphoma Mother   . Heart disease Father        MI, CABG, CAD  . Diabetes Father   . Hyperlipidemia Sister   . Depression Sister   . Anxiety disorder Sister   . Cancer Maternal Grandmother        colon, breast   . Diabetes Maternal Grandmother   . Breast cancer Maternal  Grandmother 50  . Alcohol abuse Paternal Grandfather   . Hypertension Sister   . Depression Sister   . Anxiety disorder Sister   . Diabetes Maternal Aunt   . Lymphoma Maternal Aunt    Allergies  Allergen Reactions  . Penicillins Other (See Comments)    Has patient had a PCN reaction causing immediate rash, facial/tongue/throat swelling, SOB or lightheadedness with hypotension: Unknown Has patient had a PCN reaction causing severe rash involving mucus membranes or skin necrosis: Unknown Has patient had a PCN reaction that required hospitalization: Unknown Has patient had a PCN reaction occurring within the last 10 years: Unknown If all of the above answers are "NO", then may proceed with Cephalosporin use.   . Trazodone And Nefazodone Other (See Comments)    Suicidal Ideation    Patient Care Team: Margaretann Loveless, PA-C as PCP - General (Family Medicine)   Medications: Outpatient Medications Prior to Visit  Medication Sig  . acetaminophen (TYLENOL) 500 MG tablet Take 500 mg by mouth every 6 (six) hours as needed for mild pain or headache.   . busPIRone (BUSPAR) 10 MG tablet TAKE 1 TABLET(10 MG) BY MOUTH TWICE DAILY  . Vitamin D, Ergocalciferol, (DRISDOL) 50000 units CAPS capsule Take 1 capsule (50,000 Units total) by mouth every 7 (seven) days.  Marland Kitchen escitalopram (LEXAPRO) 20 MG tablet TAKE 1 TABLET(20 MG) BY MOUTH AT BEDTIME (Patient not taking: Reported on 04/01/2019)  . metFORMIN (GLUCOPHAGE-XR) 500 MG 24 hr tablet TAKE 1 TABLET(500 MG) BY MOUTH DAILY WITH BREAKFAST (Patient not taking: Reported on 04/01/2019)  . sertraline (ZOLOFT) 100 MG tablet TAKE 1 TABLET(100 MG) BY MOUTH DAILY  . sulfamethoxazole-trimethoprim (BACTRIM DS) 800-160 MG tablet Take 1 tablet by mouth 2 (two) times daily.   No facility-administered medications prior to visit.    Review of Systems  Constitutional: Positive for chills and diaphoresis.  HENT: Negative.   Eyes: Negative.   Respiratory: Negative.    Cardiovascular: Negative.   Gastrointestinal: Positive for diarrhea and nausea.  Endocrine: Negative.   Genitourinary: Positive for enuresis and urgency.  Musculoskeletal: Negative.   Skin: Negative.   Allergic/Immunologic: Negative.   Neurological: Negative.   Hematological: Negative.   Psychiatric/Behavioral: Positive for agitation, decreased concentration, dysphoric mood, sleep disturbance and suicidal ideas ("thoughts"). The patient is nervous/anxious.       Objective    BP 122/87 (BP Location: Left Arm, Patient Position: Sitting, Cuff Size: Large)   Pulse 82   Temp (!) 97.3 F (36.3 C) (Temporal)   Resp 16   Ht 5' 5.5" (1.664 m)   Wt (!) 345 lb (156.5 kg)   BMI 56.54 kg/m  Physical Exam Vitals reviewed.  Constitutional:      General: She is not in acute distress.    Appearance: Normal appearance. She is well-developed. She is obese. She is not ill-appearing or diaphoretic.  HENT:     Head: Normocephalic and atraumatic.     Right Ear: Hearing, tympanic membrane, ear canal and external ear normal.     Left Ear: Hearing, tympanic membrane, ear canal and external ear normal.     Nose: Nose normal.     Mouth/Throat:     Mouth: Mucous membranes are moist.     Pharynx: Oropharynx is clear. Uvula midline. No oropharyngeal exudate.  Eyes:     General: No scleral icterus.       Right eye: No discharge.        Left eye: No discharge.     Extraocular Movements: Extraocular movements intact.     Conjunctiva/sclera: Conjunctivae normal.     Pupils: Pupils are equal, round, and reactive to light.  Neck:     Thyroid: No thyromegaly.     Vascular: No carotid bruit or JVD.     Trachea: No tracheal deviation.  Cardiovascular:     Rate and Rhythm: Normal rate and regular rhythm.     Heart sounds: Normal heart sounds. No murmur heard.  No friction rub. No gallop.   Pulmonary:     Effort: Pulmonary effort is normal. No respiratory distress.     Breath sounds: Normal breath  sounds. No wheezing or rales.  Chest:     Chest wall: No tenderness.     Breasts: Breasts are symmetrical.        Right: No inverted nipple, mass, nipple discharge, skin change or tenderness.        Left: No inverted nipple, mass, nipple discharge, skin change or tenderness.  Abdominal:     General: Abdomen is protuberant. Bowel sounds are normal. There is no distension.     Palpations: Abdomen is soft. There is no mass.     Tenderness: There is no abdominal tenderness. There is no guarding or rebound.     Hernia: There is no hernia in the left inguinal area.  Genitourinary:    General: Normal vulva.     Exam position: Supine.     Labia:        Right: No rash, tenderness, lesion or injury.        Left: No rash, tenderness, lesion or injury.      Vagina: Normal. No signs of injury. No vaginal discharge, erythema, tenderness or bleeding.     Cervix: No cervical motion tenderness, discharge or friability.     Adnexa:        Right: No mass, tenderness or fullness.         Left: No mass, tenderness or fullness.       Rectum: Normal.  Musculoskeletal:        General: No tenderness. Normal range of motion.     Cervical back: Normal range of motion and neck supple.  Lymphadenopathy:     Cervical: No cervical adenopathy.  Skin:    General: Skin is warm and dry.     Capillary Refill: Capillary refill takes less than 2 seconds.     Findings: No rash.  Neurological:     General: No focal deficit present.     Mental Status: She is alert and oriented to person, place, and time. Mental status is at baseline.     Cranial Nerves: No cranial  nerve deficit.     Coordination: Coordination normal.     Deep Tendon Reflexes: Reflexes are normal and symmetric.  Psychiatric:        Behavior: Behavior normal.        Thought Content: Thought content normal.        Judgment: Judgment normal.      Last depression screening scores PHQ 2/9 Scores 06/24/2020 09/05/2018 06/27/2018  PHQ - 2 Score PHQ- 9 Score Last fall risk screening Fall Risk  06/24/2020  Falls in the past year? 0  Number falls in past yr: 0  Injury with Fall? 0  Risk for fall due to : No Fall Risks  Follow up Falls evaluation completed   Last Audit-C alcohol use screening Alcohol Use Disorder Test (AUDIT) 06/27/2018  1. How often do you have a drink containing alcohol? 1  2. How many drinks containing alcohol do you have on a typical day when you are drinking? 0  3. How often do you have six or more drinks on one occasion? 0  AUDIT-C Score 1  Alcohol Brief Interventions/Follow-up AUDIT Score <7 follow-up not indicated   A score of 3 or more in women, and 4 or more in men indicates increased risk for alcohol abuse, EXCEPT if all of the points are from question 1   No results found for any visits on 06/24/20.  Assessment & Plan    Routine Health Maintenance and Physical Exam  Exercise Activities and Dietary recommendations Goals   None     Immunization History  Administered Date(s) Administered  . Influenza Split 10/01/2013  . Influenza,inj,Quad PF,6+ Mos 12/28/2015, 09/05/2018  . Influenza-Unspecified 10/17/2016, 10/03/2017  . Moderna SARS-COVID-2 Vaccination 02/06/2020, 03/05/2020  . Tdap 06/27/2018    Health Maintenance  Topic Date Due  . Hepatitis C Screening  Never done  . PAP SMEAR-Modifier  Never done  . INFLUENZA VACCINE  07/10/2020  . TETANUS/TDAP  06/27/2028  . COVID-19 Vaccine  Completed  . HIV Screening  Discontinued    Discussed health benefits of physical activity, and encouraged her to engage in regular exercise appropriate for her age and condition.  1. Annual physical exam Normal physical exam today. Will check labs as below and f/u pending lab results. If labs are stable and WNL she will not need to have these rechecked for one year at her next annual physical exam. She is to call the office in the meantime if she has any acute issue, questions or concerns.  2.  Family history of breast cancer Breast exam today was normal. There is family history of breast cancer in maternal grandmother. She does perform regular self breast exams. Mammogram was ordered as below. Information for [facility] Breast clinic was given to patient so she may schedule her mammogram at her convenience. - MM 3D SCREEN BREAST BILATERAL; Future  3. Encounter for breast cancer screening using non-mammogram modality See above medical treatment plan. - MM 3D SCREEN BREAST BILATERAL; Future  4. Cervical cancer screening Pap collected today. Will send as below and f/u pending results. - Cytology - PAP  5. PCOS (polycystic ovarian syndrome) Will check labs as below and f/u pending results. Continue metformin for PCOS symptoms and menstrual control.  - CBC with Differential/Platelet - Comprehensive metabolic panel - Hemoglobin A1c - Lipid panel - metFORMIN (GLUCOPHAGE) 500 MG tablet; Take 1 tablet (500 mg total) by mouth daily with breakfast.  Dispense: 90 tablet; Refill:  3  6. Borderline high serum cholesterol Diet controlled. Will check labs as below and f/u pending results. - CBC with Differential/Platelet - Comprehensive metabolic panel - Hemoglobin A1c - Lipid panel  7. Blood glucose elevated Diet controlled. Will check labs as below and f/u pending results. - CBC with Differential/Platelet - Comprehensive metabolic panel - Hemoglobin A1c - Lipid panel  8. Avitaminosis D Stable. Diagnosis pulled for medication refill. Continue current medical treatment plan. Will check labs as below and f/u pending results. - CBC with Differential/Platelet - Vitamin D, Ergocalciferol, (DRISDOL) 1.25 MG (50000 UNIT) CAPS capsule; Take 1 capsule (50,000 Units total) by mouth every 7 (seven) days.  Dispense: 12 capsule; Refill: 3 - Vitamin D (25 hydroxy)  9. Class 3 severe obesity due to excess calories with serious comorbidity and body mass index (BMI) of 50.0 to 59.9 in adult  [facility]) Counseled patient on healthy lifestyle modifications including dieting and exercise.  - CBC with Differential/Platelet - Comprehensive metabolic panel - Hemoglobin A1c - Lipid panel - TSH  10. Encounter for hepatitis C screening test for low risk patient Will check labs as below and f/u pending results. - Hepatitis C Antibody  11. MDD (major depressive disorder), recurrent episode, moderate (HCC) Will check labs as below and f/u pending results. Worsening symptoms despite lexapro 20mg . Change therapy to sertraline 100mg  as below. Add Buspar 10mg  BID for anxiety. F/U in 4-6 weeks.  - CBC with Differential/Platelet - TSH - busPIRone (BUSPAR) 10 MG tablet; TAKE 1 TABLET(10 MG) BY MOUTH TWICE DAILY  Dispense: 180 tablet; Refill: 3 - sertraline (ZOLOFT) 100 MG tablet; TAKE 1 TABLET(100 MG) BY MOUTH DAILY  Dispense: 90 tablet; Refill: 3  12. GAD (generalized anxiety disorder) See above medical treatment plan. - CBC with Differential/Platelet - TSH - busPIRone (BUSPAR) 10 MG tablet; TAKE 1 TABLET(10 MG) BY MOUTH TWICE DAILY  Dispense: 180 tablet; Refill: 3 - sertraline (ZOLOFT) 100 MG tablet; TAKE 1 TABLET(100 MG) BY MOUTH DAILY  Dispense: 90 tablet; Refill: 3   No follow-ups on file.     , PA-C, have reviewed all documentation for this visit. The documentation on 06/28/20 for the exam, diagnosis, procedures, and orders are all accurate and complete.    [facility] [phone] (phone) [phone] (fax)  [facility]

## 2020-06-25 LAB — COMPREHENSIVE METABOLIC PANEL
ALT: 40 IU/L — ABNORMAL HIGH (ref 0–32)
AST: 24 IU/L (ref 0–40)
Albumin/Globulin Ratio: 1.3 (ref 1.2–2.2)
Albumin: 4.3 g/dL (ref 3.8–4.8)
Alkaline Phosphatase: 82 IU/L (ref 48–121)
BUN/Creatinine Ratio: 14 (ref 9–23)
BUN: 14 mg/dL (ref 6–24)
Bilirubin Total: 0.6 mg/dL (ref 0.0–1.2)
CO2: 23 mmol/L (ref 20–29)
Calcium: 9.4 mg/dL (ref 8.7–10.2)
Chloride: 100 mmol/L (ref 96–106)
Creatinine, Ser: 1 mg/dL (ref 0.57–1.00)
GFR calc Af Amer: 80 mL/min/{1.73_m2} (ref >59)
GFR calc non Af Amer: 70 mL/min/{1.73_m2} (ref >59)
Globulin, Total: 3.2 g/dL (ref 1.5–4.5)
Glucose: 110 mg/dL — ABNORMAL HIGH (ref 65–99)
Potassium: 4.1 mmol/L (ref 3.5–5.2)
Sodium: 138 mmol/L (ref 134–144)
Total Protein: 7.5 g/dL (ref 6.0–8.5)

## 2020-06-25 LAB — CBC WITH DIFFERENTIAL/PLATELET
Basophils Absolute: 0 10*3/uL (ref 0.0–0.2)
Basos: 0 %
EOS (ABSOLUTE): 0 10*3/uL (ref 0.0–0.4)
Eos: 0 %
Hematocrit: 40.2 % (ref 34.0–46.6)
Hemoglobin: 13.4 g/dL (ref 11.1–15.9)
Immature Grans (Abs): 0 10*3/uL (ref 0.0–0.1)
Immature Granulocytes: 0 %
Lymphocytes Absolute: 1.7 10*3/uL (ref 0.7–3.1)
Lymphs: 19 %
MCH: 27.2 pg (ref 26.6–33.0)
MCHC: 33.3 g/dL (ref 31.5–35.7)
MCV: 82 fL (ref 79–97)
Monocytes Absolute: 0.6 10*3/uL (ref 0.1–0.9)
Monocytes: 7 %
Neutrophils Absolute: 6.8 10*3/uL (ref 1.4–7.0)
Neutrophils: 74 %
Platelets: 303 10*3/uL (ref 150–450)
RBC: 4.93 x10E6/uL (ref 3.77–5.28)
RDW: 13.6 % (ref 11.7–15.4)
WBC: 9.3 10*3/uL (ref 3.4–10.8)

## 2020-06-25 LAB — TSH: TSH: 1.44 u[IU]/mL (ref 0.450–4.500)

## 2020-06-25 LAB — LIPID PANEL
Chol/HDL Ratio: 4.4 ratio (ref 0.0–4.4)
Cholesterol, Total: 196 mg/dL (ref 100–199)
HDL: 45 mg/dL (ref >39)
LDL Chol Calc (NIH): 127 mg/dL — ABNORMAL HIGH (ref 0–99)
Triglycerides: 132 mg/dL (ref 0–149)
VLDL Cholesterol Cal: 24 mg/dL (ref 5–40)

## 2020-06-25 LAB — HEMOGLOBIN A1C
Est. average glucose Bld gHb Est-mCnc: 123 mg/dL
Hgb A1c MFr Bld: 5.9 % — ABNORMAL HIGH (ref 4.8–5.6)

## 2020-06-25 LAB — HEPATITIS C ANTIBODY: Hep C Virus Ab: <0.1 s/co ratio (ref 0.0–0.9)

## 2020-06-25 LAB — VITAMIN D 25 HYDROXY (VIT D DEFICIENCY, FRACTURES): Vit D, 25-Hydroxy: 20.4 ng/mL — ABNORMAL LOW (ref 30.0–100.0)

## 2020-06-27 ENCOUNTER — Telehealth: Payer: Self-pay

## 2020-06-27 NOTE — Telephone Encounter (Signed)
-----   Message from Margaretann Loveless, PA-C sent at 06/27/2020 10:37 AM EDT ----- Blood count is normal. Kidney function is normal. Liver enzymes are essentially stable. The ALT that was borderline high last time, remains so. Now at 40 when had been 36. Could get [place] of liver if desired. Sodium, potassium and calcium are normal. A1c also increased just slightly from 5.8 to now 5.9. Cholesterol has also increased compared to last check 2 years ago. Thyroid is normal. Hep C screen is negative. Vit D has improved but is still low at 20.4. Continue Vit D supplement.

## 2020-06-27 NOTE — Telephone Encounter (Signed)
LMTCB-If patient calls back [facility] for [facility] nurse to give resutls.

## 2020-06-28 LAB — CYTOLOGY - PAP
Comment: NEGATIVE
Diagnosis: NEGATIVE
High risk HPV: NEGATIVE

## 2020-06-28 NOTE — Telephone Encounter (Signed)
-----   Message from Margaretann Loveless, [place] sent at 06/28/2020  4:15 PM EDT ----- Pap is normal, HPV negative.  Will repeat in 5 years.

## 2020-06-28 NOTE — Telephone Encounter (Signed)
Pt. Given results and instructions. Verbalizes understanding. Will think about ultrasound and call back.

## 2020-07-01 ENCOUNTER — Other Ambulatory Visit: Payer: Self-pay

## 2020-07-01 ENCOUNTER — Encounter: Payer: Self-pay | Admitting: Physician Assistant

## 2020-07-01 ENCOUNTER — Ambulatory Visit: Payer: BC Managed Care – PPO | Admitting: Physician Assistant

## 2020-07-01 VITALS — BP 151/92 | HR 80 | Temp 96.9°F | Wt 339.0 lb

## 2020-07-01 DIAGNOSIS — F339 Major depressive disorder, recurrent, unspecified: Secondary | ICD-10-CM

## 2020-07-01 DIAGNOSIS — F5101 Primary insomnia: Secondary | ICD-10-CM | POA: Diagnosis not present

## 2020-07-01 MED ORDER — RAMELTEON 8 MG PO TABS: 8.0000 mg | ORAL_TABLET | Freq: Every day | ORAL | 0 refills | Status: DC

## 2020-07-01 NOTE — Assessment & Plan Note (Signed)
Worsening. Recently had change in therapy from escitalopram 20mg  to sertraline 100mg  on 06/24/20 Now having increasingly worse insomnia and racing thoughts. Since it has only been a week since the change will continue sertraline and add Ramelteon for sleep. Patient to send mychart message on Monday for f/u on sleep. If Ramelteon ineffective, will change to [employer].  FMLA forms for 12 week leave will be completed. F/U in 4 weeks.

## 2020-07-01 NOTE — Progress Notes (Signed)
Established patient visit   Patient: Alexis Miles   DOB: [birth year]   44 y.o. Female  MRN: [id number] Visit Date: 07/01/2020  Today's healthcare provider: Margaretann Loveless, PA-C   Chief Complaint  Patient presents with  . Depression  . Form Completion   Subjective    Depression        This is a chronic problem.  The problem occurs daily.  The problem has been gradually worsening since onset.   At last visit on 06/24/20 we changed lexapro 20mg  to sertraline 100mg . Since the change she is having issues sleeping and racing thoughts have increased to occur constantly. She reports she may sleep 1-2 hours max daily.  Pt is coming in today to complete a form for FMLA. She has requested 12 week leave starting on 06/29/20 and continuing through 09/16/20 to return to work on 09/19/20.   Medications: Outpatient Medications Prior to Visit  Medication Sig  . acetaminophen (TYLENOL) 500 MG tablet Take 500 mg by mouth every 6 (six) hours as needed for mild pain or headache.   . busPIRone (BUSPAR) 10 MG tablet TAKE 1 TABLET(10 MG) BY MOUTH TWICE DAILY  . metFORMIN (GLUCOPHAGE) 500 MG tablet Take 1 tablet (500 mg total) by mouth daily with breakfast.  . sertraline (ZOLOFT) 100 MG tablet TAKE 1 TABLET(100 MG) BY MOUTH DAILY  . Vitamin D, Ergocalciferol, (DRISDOL) 1.25 MG (50000 UNIT) CAPS capsule Take 1 capsule (50,000 Units total) by mouth every 7 (seven) days.   No facility-administered medications prior to visit.    Review of Systems  Constitutional: Negative.   Respiratory: Negative.   Cardiovascular: Negative.   Gastrointestinal: Negative.   Psychiatric/Behavioral: Positive for depression, dysphoric mood and sleep disturbance. The patient is nervous/anxious.       Objective    Temp (!) 96.9 F (36.1 C) (Temporal)   Wt (!) 339 lb (153.8 kg)   BMI 55.55 kg/m    Physical Exam Vitals reviewed.  Constitutional:      General: She is not in acute distress.    Appearance:  Normal appearance. She is well-developed. She is obese. She is not ill-appearing.  HENT:     Head: Normocephalic and atraumatic.  Pulmonary:     Effort: Pulmonary effort is normal. No respiratory distress.  Musculoskeletal:     Cervical back: Normal range of motion and neck supple.  Neurological:     Mental Status: She is alert.  Psychiatric:        Attention and Perception: Attention and perception normal.        Mood and Affect: Mood is anxious and depressed. Affect is flat.        Speech: Speech normal.        Behavior: Behavior normal. Behavior is cooperative.        Thought Content: Thought content normal.        Judgment: Judgment normal.      No results found for any visits on 07/01/20.  Assessment & Plan     Problem List Items Addressed This Visit      Other   Insomnia - Primary    Worsening since change in medications on 06/24/20. Has tried Trazodone in the past and had adverse reaction to it (suicidal ideations) Will start with Ramelteon.  Patient to send mychart message on Monday.  If Ramelteon ineffective will change to [facility].       Relevant Medications   ramelteon (ROZEREM) 8 MG tablet   Major  depressive disorder, recurrent episode with anxious distress (HCC)    Worsening. Recently had change in therapy from escitalopram 20mg  to sertraline 100mg  on 06/24/20 Now having increasingly worse insomnia and racing thoughts. Since it has only been a week since the change will continue sertraline and add Ramelteon for sleep. Patient to send mychart message on Monday for f/u on sleep. If Ramelteon ineffective, will change to [facility].  FMLA forms for 12 week leave will be completed. F/U in 4 weeks.         No follow-ups on file.      Saturday, PA-C, have reviewed all documentation for this visit. The documentation on 07/01/20 for the exam, diagnosis, procedures, and orders are all accurate and complete.   Delmer Islam  [facility] [phone] (phone) [phone] (fax)  [facility]

## 2020-07-01 NOTE — Patient Instructions (Signed)
Ramelteon tablets What is this medicine? RAMELTEON (ram EL tee on) is used to treat insomnia. This medicine helps you to fall asleep. This medicine may be used for other purposes; ask your health care provider or pharmacist if you have questions. COMMON BRAND NAME(S): Rozerem What should I tell my health care provider before I take this medicine? They need to know if you have any of these conditions:  depression  history of a drug or alcohol abuse problem  liver disease  lung or breathing disease  suicidal thoughts  an unusual or allergic reaction to ramelteon, other medicines, foods, dyes, or preservatives  pregnant or trying to get pregnant  breast-feeding How should I use this medicine? Take this medicine by mouth with a glass of water. Do not break tablets; swallow whole. Follow the directions on the prescription label. It is better to take this medicine on an empty stomach and only when you are ready for bed. Do not take your medicine more often than directed. A special MedGuide will be given to you by the pharmacist with each prescription and refill. Be sure to read this information carefully each time. Talk to your pediatrician regarding the use of this medicine in children. Special care may be needed. Overdosage: If you think you have taken too much of this medicine contact a poison control center or emergency room at once. NOTE: This medicine is only for you. Do not share this medicine with others. What if I miss a dose? This does not apply. This medicine should only be taken immediately before going to sleep. Do not take double or extra doses. What may interact with this medicine? Do not take this medicine with any of the following medications:  fluvoxamine  melatonin This medicine may also interact with the following medications:  medicines used to treat fungal infections like ketoconazole, fluconazole, or itraconazole  rifampin This list may not describe all  possible interactions. Give your health care provider a list of all the medicines, herbs, non-prescription drugs, or dietary supplements you use. Also tell them if you smoke, drink alcohol, or use illegal drugs. Some items may interact with your medicine. What should I watch for while using this medicine? Visit your doctor or health care professional for regular checks on your progress. Keep a regular sleep schedule by going to bed at about the same time each night. Avoid caffeine-containing drinks in the evening hours. Talk to your doctor if you still have trouble sleeping within 7 to 10 days of using this medicine. This may mean there is another cause for your sleep problems. After taking this medicine, you may get up out of bed and do an activity that you do not know you are doing. The next morning, you may have no memory of this. Activities include driving a car ("sleep-driving"), making and eating food, talking on the phone, sexual activity, and sleep-walking. Serious injuries have occurred. Call your doctor right away if you find out you have done any of these activities. Do not take this medicine if you have used alcohol that evening. Do not take it if you have taken another medicine for sleep. The risk of doing these sleep-related activities is higher. Do not take this medicine unless you are able to stay in bed for a full night (7 to 8 hours) before you must be active again. You may have a decrease in mental alertness the day after use, even if you feel that you are fully awake. Tell your doctor if   you will need to perform activities requiring full alertness, such as driving, the next day. Do not stand or sit up quickly after taking this medicine, especially if you are an older patient. This reduces the risk of dizzy or fainting spells. If you or your family notice any changes in your behavior, such as new or worsening depression, thoughts of harming yourself, anxiety, other unusual or disturbing  thoughts, or memory loss, call your doctor right away. What side effects may I notice from receiving this medicine? Side effects that you should report to your doctor or health care professional as soon as possible:  allergic reactions like skin rash, itching or hives, swelling of the face, lips, or tongue  breast milk production or discharge  breathing problems  joint or muscle pain  depression, suicidal thoughts  missed monthly period (for women)  unusual activities while asleep like driving, eating, making phone calls  unusually weak or tired  worsening of insomnia Side effects that usually do not require medical attention (report to your doctor or health care professional if they continue or are bothersome):  bad taste  daytime sleepiness  decreased sex drive  diarrhea  headache  nausea This list may not describe all possible side effects. Call your doctor for medical advice about side effects. You may report side effects to FDA at 1-800-FDA-1088. Where should I keep my medicine? Keep out of the reach of children. Store at room temperature between 15 and 30 degrees C (59 and 86 degrees F). Keep the container tightly closed. Protect from moisture and humidity. Throw away any unused medicine after the expiration date. NOTE: This sheet is a summary. It may not cover all possible information. If you have questions about this medicine, talk to your doctor, pharmacist, or health care provider.  2020 Elsevier/Gold Standard (2018-05-23 12:18:24)  

## 2020-07-01 NOTE — Assessment & Plan Note (Signed)
Worsening since change in medications on 06/24/20. Has tried Trazodone in the past and had adverse reaction to it (suicidal ideations) Will start with Ramelteon.  Patient to send mychart message on Monday.  If Ramelteon ineffective will change to [facility].

## 2020-07-05 ENCOUNTER — Telehealth: Payer: Self-pay

## 2020-07-05 NOTE — Telephone Encounter (Signed)
Left message that her forms are ready for pick up. We cannot fax them on the form it says return to patient.  FYI-Forms on my desk.

## 2020-08-04 NOTE — Progress Notes (Signed)
Established patient visit   Patient: Alexis Miles   DOB: 1976-07-31   44 y.o. Female  MRN: [id number] Visit Date: 08/05/2020  Today's healthcare provider: Margaretann Loveless, PA-C   Chief Complaint  Patient presents with  . Depression  . Insomnia  I,Osmin Welz M Deaunte Dente,acting as a Neurosurgeon for [employer], PA-C.,have documented all relevant documentation on the behalf of Margaretann Loveless, PA-C,as directed by  Margaretann Loveless, PA-C while in the presence of Margaretann Loveless, [place].  Subjective    HPI  Depression, Follow-up  She  was last seen for this 4 weeks ago. Changes made at last visit include continuing Sertraline 100mg  and adding Ramelteon 8mg  at bedtime.   She reports good compliance with treatment. She is not having side effects.   She reports good tolerance of treatment. Current symptoms include: none She feels she is Improved since last visit.  Depression screen [facility] 2/9 06/24/2020 09/05/2018 06/27/2018  Decreased Interest 2 0 1  Down, Depressed, Hopeless 2 1 1   PHQ - 2 Score 4 1 2   Altered sleeping 3 1 1   Tired, decreased energy 2 1 1   Change in appetite 3 3 1   Feeling bad or failure about yourself  2 1 1   Trouble concentrating 2 1 1   Moving slowly or fidgety/restless 1 0 0  Suicidal thoughts 1 0 0  PHQ-9 Score 18 8 7   Difficult doing work/chores Very difficult Somewhat difficult Somewhat difficult    Follow up for insomnia  The patient was last seen for this 4 weeks ago. Changes made at last visit include discontinuing Trazodone and starting Ramelteon 8mg  at bedtime .  She reports good compliance with treatment. She feels that condition is Improved. She is not having side effects.    Patient Active Problem List   Diagnosis Date Noted  . Major depressive disorder, recurrent episode with anxious distress (HCC) 07/22/2018  . Depression 06/27/2018  . Morbid obesity (HCC) 05/04/2016  . Anxiety 12/28/2015  . Borderline high serum  cholesterol 12/28/2015  . Accumulation of fluid in tissues 12/28/2015  . Fatigue 12/28/2015  . Blood glucose elevated 12/28/2015  . Migraine 12/28/2015  . PCOS (polycystic ovarian syndrome) 12/28/2015  . Avitaminosis D 12/28/2015  . Insomnia 12/28/2015   Past Medical History:  Diagnosis Date  . Anxiety   . Diabetes mellitus, type II (HCC)   . PCOS (polycystic ovarian syndrome)        Medications: Outpatient Medications Prior to Visit  Medication Sig  . acetaminophen (TYLENOL) 500 MG tablet Take 500 mg by mouth every 6 (six) hours as needed for mild pain or headache.   . busPIRone (BUSPAR) 10 MG tablet TAKE 1 TABLET(10 MG) BY MOUTH TWICE DAILY  . metFORMIN (GLUCOPHAGE) 500 MG tablet Take 1 tablet (500 mg total) by mouth daily with breakfast.  . sertraline (ZOLOFT) 100 MG tablet TAKE 1 TABLET(100 MG) BY MOUTH DAILY  . Vitamin D, Ergocalciferol, (DRISDOL) 1.25 MG (50000 UNIT) CAPS capsule Take 1 capsule (50,000 Units total) by mouth every 7 (seven) days.  . [DISCONTINUED] ramelteon (ROZEREM) 8 MG tablet Take 1 tablet (8 mg total) by mouth at bedtime.   No facility-administered medications prior to visit.    Review of Systems  Last CBC Lab Results  Component Value Date   WBC 9.3 06/24/2020   HGB 13.4 06/24/2020   HCT 40.2 06/24/2020   MCV 82 06/24/2020   MCH 27.2 06/24/2020   RDW 13.6 06/24/2020  PLT 303 06/24/2020   Last metabolic panel Lab Results  Component Value Date   GLUCOSE 110 (H) 06/24/2020   NA 138 06/24/2020   K 4.1 06/24/2020   CL 100 06/24/2020   CO2 23 06/24/2020   BUN 14 06/24/2020   CREATININE 1.00 06/24/2020   GFRNONAA 70 06/24/2020   GFRAA 80 06/24/2020   CALCIUM 9.4 06/24/2020   PROT 7.5 06/24/2020   ALBUMIN 4.3 06/24/2020   LABGLOB 3.2 06/24/2020   AGRATIO 1.3 06/24/2020   BILITOT 0.6 06/24/2020   ALKPHOS 82 06/24/2020   AST 24 06/24/2020   ALT 40 (H) 06/24/2020   ANIONGAP 8 07/22/2018      Objective    BP 136/86 (BP Location: Left  Arm, Patient Position: Sitting, Cuff Size: Large)   Pulse 84   Temp 98.2 F (36.8 C) (Oral)   Wt (!) 342 lb (155.1 kg)   BMI 56.05 kg/m  BP Readings from Last 3 Encounters:  08/05/20 136/86  07/01/20 (!) 151/92  06/24/20 122/87   Wt Readings from Last 3 Encounters:  08/05/20 (!) 342 lb (155.1 kg)  07/01/20 (!) 339 lb (153.8 kg)  06/24/20 (!) 345 lb (156.5 kg)      Physical Exam Constitutional:      Appearance: Normal appearance. She is obese.  Skin:    General: Skin is warm and dry.  Neurological:     General: No focal deficit present.     Mental Status: She is alert and oriented to person, place, and time.  Psychiatric:        Mood and Affect: Mood normal.        Behavior: Behavior normal.       No results found for any visits on 08/05/20.  Assessment & Plan     1. Primary insomnia Improved. Continue Ramelteon as below. Call if worsening.  - ramelteon (ROZEREM) 8 MG tablet; Take 1 tablet (8 mg total) by mouth at bedtime.  Dispense: 90 tablet; Refill: 3  2. Mild episode of recurrent major depressive disorder (HCC) Improved. Continue Sertraline 100mg  daily. Use Buspar 10mg  BID. Call if worsening again.   3. Anxiety See above medical treatment plan.   No follow-ups on file.      , PA-C, have reviewed all documentation for this visit. The documentation on 08/09/20 for the exam, diagnosis, procedures, and orders are all accurate and complete.   Delmer Islam  [facility] [phone] (phone) [phone] (fax)  [facility]

## 2020-08-05 ENCOUNTER — Other Ambulatory Visit: Payer: Self-pay

## 2020-08-05 ENCOUNTER — Ambulatory Visit: Payer: BC Managed Care – PPO | Admitting: Physician Assistant

## 2020-08-05 ENCOUNTER — Encounter: Payer: Self-pay | Admitting: Physician Assistant

## 2020-08-05 VITALS — BP 136/86 | HR 84 | Temp 98.2°F | Wt 342.0 lb

## 2020-08-05 DIAGNOSIS — F330 Major depressive disorder, recurrent, mild: Secondary | ICD-10-CM | POA: Diagnosis not present

## 2020-08-05 DIAGNOSIS — F419 Anxiety disorder, unspecified: Secondary | ICD-10-CM

## 2020-08-05 DIAGNOSIS — F5101 Primary insomnia: Secondary | ICD-10-CM | POA: Diagnosis not present

## 2020-08-05 MED ORDER — RAMELTEON 8 MG PO TABS: 8.0000 mg | ORAL_TABLET | Freq: Every day | ORAL | 3 refills | Status: DC

## 2020-08-09 ENCOUNTER — Encounter: Payer: Self-pay | Admitting: Physician Assistant

## 2020-08-09 NOTE — Patient Instructions (Signed)
10 Relaxation Techniques That Zap Stress Fast By Jeannette Moninger   Listen  Relax. You deserve it, it's good for you, and it takes less time than you think. You don't need a spa weekend or a retreat. Each of these stress-relieving tips can get you from OMG to om in less than 15 minutes. 1. Meditate  A few minutes of practice per day can help ease anxiety. "Research suggests that daily meditation may alter the brain's neural pathways, making you more resilient to stress," says psychologist Robbie Maller Hartman, PhD, a [place] health and wellness coach. It's simple. Sit up straight with both feet on the floor. Close your eyes. Focus your attention on reciting -- out loud or silently -- a positive mantra such as "I feel at peace" or "I love myself." Place one hand on your belly to sync the mantra with your breaths. Let any distracting thoughts float by like clouds. 2. Breathe Deeply  Take a 5-minute break and focus on your breathing. Sit up straight, eyes closed, with a hand on your belly. Slowly inhale through your nose, feeling the breath start in your abdomen and work its way to the top of your head. Reverse the process as you exhale through your mouth.  "Deep breathing counters the effects of stress by slowing the heart rate and lowering blood pressure," psychologist Judith Tutin, PhD, says. She's a certified life coach in [place], [place] 3. Be Present  Slow down.  "Take 5 minutes and focus on only one behavior with awareness," Tutin says. Notice how the air feels on your face when you're walking and how your feet feel hitting the ground. Enjoy the texture and taste of each bite of food. When you spend time in the moment and focus on your senses, you should feel less tense. 4. Reach Out  Your social network is one of your best tools for handling stress. Talk to others -- preferably face to face, or at least on the phone. Share what's going on. You can get a fresh perspective while keeping your  connection strong. 5. Tune In to Your Body  Mentally scan your body to get a sense of how stress affects it each day. Lie on your back, or sit with your feet on the floor. Start at your toes and work your way up to your scalp, noticing how your body feels.  10 Relaxation Techniques That Zap Stress Fast By Jeannette Moninger   Listen  "Simply be aware of places you feel tight or loose without trying to change anything," Tutin says. For 1 to 2 minutes, imagine each deep breath flowing to that body part. Repeat this process as you move your focus up your body, paying close attention to sensations you feel in each body part. 6. Decompress  Place a warm heat wrap around your neck and shoulders for 10 minutes. Close your eyes and relax your face, neck, upper chest, and back muscles. Remove the wrap, and use a tennis ball or foam roller to massage away tension.  "Place the ball between your back and the wall. Lean into the ball, and hold gentle pressure for up to 15 seconds. Then move the ball to another spot, and apply pressure," says Cathy Benninger, a nurse practitioner and assistant professor at [employer] [facility] in [place]. 7. Laugh Out Loud  A good belly laugh doesn't just lighten the load mentally. It lowers cortisol, your body's stress hormone, and boosts brain chemicals called endorphins, which help   your mood. Lighten up by tuning in to your favorite sitcom or video, reading the comics, or chatting with someone who makes you smile. 8. Crank Up the Tunes  Research shows that listening to soothing music can lower blood pressure, heart rate, and anxiety. "Create a playlist of songs or nature sounds (the ocean, a bubbling brook, birds chirping), and allow your mind to focus on the different melodies, instruments, or singers in the piece," Benninger says. You also can blow off steam by rocking out to more upbeat tunes -- or singing at the top of your lungs! 9. Get Moving   You don't have to run in order to get a runner's high. All forms of exercise, including yoga and walking, can ease depression and anxiety by helping the brain release feel-good chemicals and by giving your body a chance to practice dealing with stress. You can go for a quick walk around the block, take the stairs up and down a few flights, or do some stretching exercises like head rolls and shoulder shrugs. 10. Be Grateful  Keep a gratitude journal or several (one by your bed, one in your purse, and one at work) to help you remember all the things that are good in your life.  "Being grateful for your blessings cancels out negative thoughts and worries," says Joni Emmerling, a wellness coach in [place], [place].  Use these journals to savor good experiences like a child's smile, a sunshine-filled day, and good health. Don't forget to celebrate accomplishments like mastering a new task at work or a new hobby. When you start feeling stressed, spend a few minutes looking through your notes to remind yourself what really matters.  

## 2020-09-08 ENCOUNTER — Telehealth: Payer: Self-pay | Admitting: Physician Assistant

## 2020-09-08 NOTE — Telephone Encounter (Signed)
Pt called in to request a return to work note. Pt says that she was taken out of work by PCP. Pt would like to pick up when ready.    [phone]

## 2020-09-09 NOTE — Telephone Encounter (Signed)
Patient stated she is going back on 09/19/2020. She was wanting to get the note now so she can go ahead and turn it in to her employers. Patient needs note to be on letterhead or script paper.

## 2020-09-09 NOTE — Telephone Encounter (Signed)
Is she wanting to go back immediately or for the scheduled 10/8?

## 2020-09-09 NOTE — Telephone Encounter (Signed)
Patient was advised.  

## 2020-09-09 NOTE — Telephone Encounter (Signed)
OK perfect. Will provide note. Letter printed.

## 2021-02-20 ENCOUNTER — Telehealth: Payer: Self-pay | Admitting: Physician Assistant

## 2021-02-20 NOTE — Telephone Encounter (Signed)
Pt asked to get a call from [facility] or nurse about getting an Rx for Birth control/ No particular kind/ Pt stated she would like to know what her provider reccommends for her knowing her health conditions and background / please advise

## 2021-02-20 NOTE — Telephone Encounter (Signed)
It would probably be best for her to come in and we can discuss more. We do a pregnancy test before new start birth control which she would need to have done in office anyway.   Please schedule

## 2021-02-21 NOTE — Telephone Encounter (Signed)
Left message to schedule an appointment.  PCP please schedule when she calls back.   Thanks,   -Vernona Rieger

## 2021-02-24 ENCOUNTER — Other Ambulatory Visit: Payer: Self-pay

## 2021-02-24 ENCOUNTER — Ambulatory Visit: Payer: BC Managed Care – PPO | Admitting: Physician Assistant

## 2021-02-24 VITALS — BP 148/70 | HR 85 | Temp 98.8°F | Resp 16 | Wt 352.2 lb

## 2021-02-24 DIAGNOSIS — Z30011 Encounter for initial prescription of contraceptive pills: Secondary | ICD-10-CM

## 2021-02-24 MED ORDER — NORETHINDRONE 0.35 MG PO TABS: 1.0000 | ORAL_TABLET | Freq: Every day | ORAL | 4 refills | Status: DC

## 2021-02-24 NOTE — Progress Notes (Signed)
Established patient visit   Patient: Alexis Miles   DOB: [birth year]   45 y.o. Female  MRN: [id number] Visit Date: 02/24/2021  Today's healthcare provider: Margaretann Loveless, PA-C   Chief Complaint  Patient presents with  . Contraception    Patient comes in office today to discuss birth control options, patient states that she has a PMH of PCOS and states that she has never been sexually active. Patient reports that menstrual cycles are irregular.   Subjective    HPI HPI    Contraception     Additional comments: Patient comes in office today to discuss birth control options, patient states that she has a PMH of PCOS and states that she has never been sexually active. Patient reports that menstrual cycles are irregular.       Last edited by Fonda Kinder, CMA on 02/24/2021  3:28 PM. (History)       Patient Active Problem List   Diagnosis Date Noted  . Major depressive disorder, recurrent episode with anxious distress (HCC) 07/22/2018  . Depression 06/27/2018  . Morbid obesity (HCC) 05/04/2016  . Anxiety 12/28/2015  . Borderline high serum cholesterol 12/28/2015  . Accumulation of fluid in tissues 12/28/2015  . Fatigue 12/28/2015  . Blood glucose elevated 12/28/2015  . Migraine 12/28/2015  . PCOS (polycystic ovarian syndrome) 12/28/2015  . Avitaminosis D 12/28/2015  . Insomnia 12/28/2015   Past Medical History:  Diagnosis Date  . Anxiety   . Diabetes mellitus, type II (HCC)   . PCOS (polycystic ovarian syndrome)        Medications: Outpatient Medications Prior to Visit  Medication Sig  . acetaminophen (TYLENOL) 500 MG tablet Take 500 mg by mouth every 6 (six) hours as needed for mild pain or headache.   . busPIRone (BUSPAR) 10 MG tablet TAKE 1 TABLET(10 MG) BY MOUTH TWICE DAILY  . metFORMIN (GLUCOPHAGE) 500 MG tablet Take 1 tablet (500 mg total) by mouth daily with breakfast.  . ramelteon (ROZEREM) 8 MG tablet Take 1 tablet (8 mg total) by mouth at  bedtime.  . sertraline (ZOLOFT) 100 MG tablet TAKE 1 TABLET(100 MG) BY MOUTH DAILY  . Vitamin D, Ergocalciferol, (DRISDOL) 1.25 MG (50000 UNIT) CAPS capsule Take 1 capsule (50,000 Units total) by mouth every 7 (seven) days.   No facility-administered medications prior to visit.    Review of Systems  Constitutional: Negative.   Respiratory: Negative.   Cardiovascular: Negative.   Gastrointestinal: Negative.   Genitourinary: Positive for menstrual problem.    Last CBC Lab Results  Component Value Date   WBC 9.3 06/24/2020   HGB 13.4 06/24/2020   HCT 40.2 06/24/2020   MCV 82 06/24/2020   MCH 27.2 06/24/2020   RDW 13.6 06/24/2020   PLT 303 06/24/2020   Last metabolic panel Lab Results  Component Value Date   GLUCOSE 110 (H) 06/24/2020   NA 138 06/24/2020   K 4.1 06/24/2020   CL 100 06/24/2020   CO2 23 06/24/2020   BUN 14 06/24/2020   CREATININE 1.00 06/24/2020   GFRNONAA 70 06/24/2020   GFRAA 80 06/24/2020   CALCIUM 9.4 06/24/2020   PROT 7.5 06/24/2020   ALBUMIN 4.3 06/24/2020   LABGLOB 3.2 06/24/2020   AGRATIO 1.3 06/24/2020   BILITOT 0.6 06/24/2020   ALKPHOS 82 06/24/2020   AST 24 06/24/2020   ALT 40 (H) 06/24/2020   ANIONGAP 8 07/22/2018       Objective    BP Marland Kitchen)  148/70   Pulse 85   Temp 98.8 F (37.1 C) (Oral)   Resp 16   Wt (!) 352 lb 3.2 oz (159.8 kg)   SpO2 100%   BMI 57.72 kg/m  BP Readings from Last 3 Encounters:  02/24/21 (!) 148/70  08/05/20 136/86  07/01/20 (!) 151/92   Wt Readings from Last 3 Encounters:  02/24/21 (!) 352 lb 3.2 oz (159.8 kg)  08/05/20 (!) 342 lb (155.1 kg)  07/01/20 (!) 339 lb (153.8 kg)       Physical Exam Vitals reviewed.  Constitutional:      General: She is not in acute distress.    Appearance: Normal appearance. She is well-developed. She is obese. She is not ill-appearing or diaphoretic.  HENT:     Head: Normocephalic and atraumatic.  Cardiovascular:     Rate and Rhythm: Normal rate and regular rhythm.      Heart sounds: Normal heart sounds. No murmur heard. No friction rub. No gallop.   Pulmonary:     Effort: Pulmonary effort is normal. No respiratory distress.     Breath sounds: Normal breath sounds. No wheezing or rales.  Musculoskeletal:     Cervical back: Normal range of motion and neck supple.  Neurological:     Mental Status: She is alert.      No results found for any visits on 02/24/21.  Assessment & Plan     1. Encounter for oral contraception initial prescription Patient is not sexually active at this time and is using OCP for control of menses, lessening of symptoms and lessening of PCOS symptoms. Will start with Camila as below to avoid estrogen if possible. Call if side effects or achievement of lessening symptoms is not reached.  - norethindrone (CAMILA) 0.35 MG tablet; Take 1 tablet (0.35 mg total) by mouth daily. Take continuous  Dispense: 84 tablet; Refill: 4   No follow-ups on file.      Delmer Islam, PA-C, have reviewed all documentation for this visit. The documentation on 03/14/21 for the exam, diagnosis, procedures, and orders are all accurate and complete.   Reine Just  [facility] [phone] (phone) [phone] (fax)  [facility]

## 2021-02-24 NOTE — Patient Instructions (Signed)
Norethindrone tablets (contraception) What is this medicine? NORETHINDRONE (nor eth IN drone) is an oral contraceptive. The product contains a female hormone known as a progestin. It is used to prevent pregnancy. This medicine may be used for other purposes; ask your health care provider or pharmacist if you have questions. COMMON BRAND NAME(S): Camila, Deblitane 28-Day, Errin, Heather, Jencycla, Jolivette, Lyza, Nor-QD, Nora-BE, Norlyroc, Ortho Micronor, Sharobel 28-Day What should I tell my health care provider before I take this medicine? They need to know if you have any of these conditions:  blood vessel disease or blood clots  breast, cervical, or vaginal cancer  diabetes  heart disease  kidney disease  liver disease  mental depression  migraine  seizures  stroke  vaginal bleeding  an unusual or allergic reaction to norethindrone, other medicines, foods, dyes, or preservatives  pregnant or trying to get pregnant  breast-feeding How should I use this medicine? Take this medicine by mouth with a glass of water. You may take it with or without food. Follow the directions on the prescription label. Take this medicine at the same time each day and in the order directed on the package. Do not take your medicine more often than directed. Contact your pediatrician regarding the use of this medicine in children. Special care may be needed. This medicine has been used in female children who have started having menstrual periods. A patient package insert for the product will be given with each prescription and refill. Read this sheet carefully each time. The sheet may change frequently. Overdosage: If you think you have taken too much of this medicine contact a poison control center or emergency room at once. NOTE: This medicine is only for you. Do not share this medicine with others. What if I miss a dose? Try not to miss a dose. Every time you miss a dose or take a dose late  your chance of pregnancy increases. When 1 pill is missed (even if only 3 hours late), take the missed pill as soon as possible and continue taking a pill each day at the regular time (use a back up method of birth control for the next 48 hours). If more than 1 dose is missed, use an additional birth control method for the rest of your pill pack until menses occurs. Contact your health care professional if more than 1 dose has been missed. What may interact with this medicine? Do not take this medicine with any of the following medications:  amprenavir or fosamprenavir  bosentan This medicine may also interact with the following medications:  antibiotics or medicines for infections, especially rifampin, rifabutin, rifapentine, and griseofulvin, and possibly penicillins or tetracyclines  aprepitant  barbiturate medicines, such as phenobarbital  carbamazepine  felbamate  modafinil  oxcarbazepine  phenytoin  ritonavir or other medicines for HIV infection or AIDS  St. John's wort  topiramate This list may not describe all possible interactions. Give your health care provider a list of all the medicines, herbs, non-prescription drugs, or dietary supplements you use. Also tell them if you smoke, drink alcohol, or use illegal drugs. Some items may interact with your medicine. What should I watch for while using this medicine? Visit your doctor or health care professional for regular checks on your progress. You will need a regular breast and pelvic exam and Pap smear while on this medicine. Use an additional method of birth control during the first cycle that you take these tablets. If you have any reason to think you   are pregnant, stop taking this medicine right away and contact your doctor or health care professional. If you are taking this medicine for hormone related problems, it may take several cycles of use to see improvement in your condition. This medicine does not protect you  against HIV infection (AIDS) or any other sexually transmitted diseases. What side effects may I notice from receiving this medicine? Side effects that you should report to your doctor or health care professional as soon as possible:  breast tenderness or discharge  pain in the abdomen, chest, groin or leg  severe headache  skin rash, itching, or hives  sudden shortness of breath  unusually weak or tired  vision or speech problems  yellowing of skin or eyes Side effects that usually do not require medical attention (report to your doctor or health care professional if they continue or are bothersome):  changes in sexual desire  change in menstrual flow  facial hair growth  fluid retention and swelling  headache  irritability  nausea  weight gain or loss This list may not describe all possible side effects. Call your doctor for medical advice about side effects. You may report side effects to FDA at 1-800-FDA-1088. Where should I keep my medicine? Keep out of the reach of children. Store at room temperature between 15 and 30 degrees C (59 and 86 degrees F). Throw away any unused medicine after the expiration date. NOTE: This sheet is a summary. It may not cover all possible information. If you have questions about this medicine, talk to your doctor, pharmacist, or health care provider.  2021 Elsevier/Gold Standard (2012-08-15 16:41:35)  

## 2021-03-14 ENCOUNTER — Encounter: Payer: Self-pay | Admitting: Physician Assistant

## 2021-05-17 ENCOUNTER — Telehealth: Payer: Self-pay

## 2021-05-17 DIAGNOSIS — Z206 Contact with and (suspected) exposure to human immunodeficiency virus [HIV]: Secondary | ICD-10-CM

## 2021-05-17 NOTE — Telephone Encounter (Addendum)
I think Dr. Sullivan Lone is the only provider in the office at this time. Please review and advise.  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

## 2021-05-17 NOTE — Telephone Encounter (Signed)
Tried calling patient. Left message to call back. She needs an office visit for evaluation of exposure to STD. Our office does not have any available appointments this week, so patient may need to be seen at an urgent care. OK for [facility] triage to advise.

## 2021-05-17 NOTE — Telephone Encounter (Signed)
Patient had unprotected intercourse on Monday. Things have not gone well with partner and he has made remarks that concern her. She did go to UC- she had screening testing done- no results yet and they would not treat for anything prophylacticly. Patient would like prophylactic HIV treatment because the partner stated she had already been exposed and her life is over. Patient is not sure what he was talking about. Health dept does not do this treatment- she would like for PCP to do this for her- in the case the partner does have HIV- she needs treatment within 72 hours. May call her at anytime- has dental appointment at 4:10- otherwise she is available.

## 2021-05-17 NOTE — Telephone Encounter (Signed)
Copied from CRM [id number]. Topic: General - Other >> May 17, 2021  8:16 AM Tamela Oddi wrote: Reason for CRM: Patient would like to talk with the sex regarding a sensitive matter.  Patient had unprotected sex and is afraid of a disease and was told there was a medication she could take within 72 hrs.  Please advise and call patient to discuss at [phone]

## 2021-05-18 ENCOUNTER — Other Ambulatory Visit
Admission: RE | Admit: 2021-05-18 | Discharge: 2021-05-18 | Disposition: A | Discharge status: Home / Self Care | Payer: BC Managed Care – PPO | Attending: Family Medicine | Admitting: Family Medicine

## 2021-05-18 DIAGNOSIS — Z206 Contact with and (suspected) exposure to human immunodeficiency virus [HIV]: Secondary | ICD-10-CM | POA: Diagnosis not present

## 2021-05-18 LAB — HCG, QUANTITATIVE, PREGNANCY: hCG, Beta Chain, Quant, S: <1 m[IU]/mL (ref <5)

## 2021-05-18 MED ORDER — RALTEGRAVIR POTASSIUM 400 MG PO TABS: 400.0000 mg | ORAL_TABLET | Freq: Two times a day (BID) | ORAL | 0 refills | Status: DC

## 2021-05-18 MED ORDER — EMTRICITABINE-TENOFOVIR DF 200-300 MG PO TABS: 1.0000 | ORAL_TABLET | Freq: Every day | ORAL | 0 refills | Status: DC

## 2021-05-18 NOTE — Telephone Encounter (Signed)
atient Calls (Newest Message First)  Alexis Miles., MD  Benjiman Core, CMA; Sullivan Lone Nurse 24 minutes ago (10:17 AM)     NPEP and Test today then f/u with someone in 28 days. thanks

## 2021-05-18 NOTE — Telephone Encounter (Signed)
I contacted patient who advised me that she had HIV testing at [facility] and results are still pending, no other labs were drawn and urine pregnancy test was not done. After speaking with dr. Sullivan Lone, patient needs Hcg testing to rule out before starting treatment. Advised patient to contact [facility] to have records sent over and advised her that I will be placing a STAT order for HCg test and she is to report to [facility] for testing today. Patient understood and 1 week follow up has been scheduled with Dr. Sherrie Mustache. KW

## 2021-05-26 ENCOUNTER — Other Ambulatory Visit: Payer: Self-pay

## 2021-05-26 ENCOUNTER — Encounter: Payer: Self-pay | Admitting: Family Medicine

## 2021-05-26 ENCOUNTER — Ambulatory Visit: Payer: BC Managed Care – PPO | Admitting: Family Medicine

## 2021-05-26 VITALS — BP 115/74 | HR 92 | Temp 97.1°F | Resp 22 | Wt 351.0 lb

## 2021-05-26 DIAGNOSIS — R739 Hyperglycemia, unspecified: Secondary | ICD-10-CM | POA: Diagnosis not present

## 2021-05-26 DIAGNOSIS — E559 Vitamin D deficiency, unspecified: Secondary | ICD-10-CM

## 2021-05-26 DIAGNOSIS — Z299 Encounter for prophylactic measures, unspecified: Secondary | ICD-10-CM

## 2021-05-26 DIAGNOSIS — Z79899 Other long term (current) drug therapy: Secondary | ICD-10-CM

## 2021-05-26 NOTE — Patient Instructions (Signed)
Please stop by [address] on or about Wednesday June 22nd to have your blood drawn for required monitoring of your medications. You do not need be fasting.

## 2021-05-26 NOTE — Progress Notes (Signed)
Established patient visit   Patient: Alexis Miles   DOB: August 09, 1976   45 y.o. Female  MRN: [id number] Visit Date: 05/26/2021  Today's healthcare provider: Lelon Huh, MD   Chief Complaint  Patient presents with   Exposure to STD   Follow-up   Subjective    HPI  Follow up for possibe STD exposure:  Patient called into the office on 05/17/2021 reporting a possible HIV exposure with unprotected intercourse. Patient has been seen at an urgent care and pre PEP labs were ordered. She started course of PEP on 05/18/2021 and has had no adverse effects. Denies any GU symptoms since incident.   She reports good compliance with treatment. She feels that condition is Unchanged. She is not having side effects.   -----------------------------------------------------------------------------------------      Medications: Outpatient Medications Prior to Visit  Medication Sig   acetaminophen (TYLENOL) 500 MG tablet Take 500 mg by mouth every 6 (six) hours as needed for mild pain or headache.    busPIRone (BUSPAR) 10 MG tablet TAKE 1 TABLET(10 MG) BY MOUTH TWICE DAILY   emtricitabine-tenofovir (TRUVADA) 200-300 MG tablet Take 1 tablet by mouth daily.   metFORMIN (GLUCOPHAGE) 500 MG tablet Take 1 tablet (500 mg total) by mouth daily with breakfast.   norethindrone (CAMILA) 0.35 MG tablet Take 1 tablet (0.35 mg total) by mouth daily. Take continuous   raltegravir (ISENTRESS) 400 MG tablet Take 1 tablet (400 mg total) by mouth 2 (two) times daily.   ramelteon (ROZEREM) 8 MG tablet Take 1 tablet (8 mg total) by mouth at bedtime.   sertraline (ZOLOFT) 100 MG tablet TAKE 1 TABLET(100 MG) BY MOUTH DAILY   Vitamin D, Ergocalciferol, (DRISDOL) 1.25 MG (50000 UNIT) CAPS capsule Take 1 capsule (50,000 Units total) by mouth every 7 (seven) days.   No facility-administered medications prior to visit.    Review of Systems     Objective    BP 115/74 (BP Location: Left Wrist, Patient  Position: Sitting, Cuff Size: Large)   Pulse 92   Temp (!) 97.1 F (36.2 C) (Temporal)   Resp (!) 22   Wt (!) 351 lb (159.2 kg)   BMI 57.52 kg/m     Physical Exam  Awake, alert, oriented x 3. In no apparent distress     Assessment & Plan     1. Prophylactic measure Possible HIV exposure 10 days ago. Status of partner in unprotected intercourse not not, but partner alluded to her that he may have exposed her to STD. Now on PEP since 05/18/2021 with no adverse effects  2. Long-term use of high-risk medication  - CBC with Differential/Platelet - Comprehensive metabolic panel  3. Blood glucose elevated  - Hemoglobin A1c  4. Avitaminosis D  - Vitamin D (25 hydroxy)   If labs stable will recheck CBC, Met C, HIV and RPR when finished with 28 days course of PEP      The entirety of the information documented in the History of Present Illness, Review of Systems and Physical Exam were personally obtained by me. Portions of this information were initially documented by the CMA and reviewed by me for thoroughness and accuracy.     Lelon Huh, MD  [facility] [phone] (phone) [phone] (fax)  [place]

## 2021-06-02 LAB — COMPREHENSIVE METABOLIC PANEL
ALT: 26 IU/L (ref 0–32)
AST: 20 IU/L (ref 0–40)
Albumin/Globulin Ratio: 1.2 (ref 1.2–2.2)
Albumin: 3.6 g/dL — ABNORMAL LOW (ref 3.8–4.8)
Alkaline Phosphatase: 70 IU/L (ref 44–121)
BUN/Creatinine Ratio: 15 (ref 9–23)
BUN: 14 mg/dL (ref 6–24)
Bilirubin Total: 0.4 mg/dL (ref 0.0–1.2)
CO2: 23 mmol/L (ref 20–29)
Calcium: 9.3 mg/dL (ref 8.7–10.2)
Chloride: 101 mmol/L (ref 96–106)
Creatinine, Ser: 0.92 mg/dL (ref 0.57–1.00)
Globulin, Total: 3 g/dL (ref 1.5–4.5)
Glucose: 129 mg/dL — ABNORMAL HIGH (ref 65–99)
Potassium: 4.4 mmol/L (ref 3.5–5.2)
Sodium: 138 mmol/L (ref 134–144)
Total Protein: 6.6 g/dL (ref 6.0–8.5)
eGFR: 79 mL/min/{1.73_m2} (ref >59)

## 2021-06-02 LAB — CBC WITH DIFFERENTIAL/PLATELET
Basophils Absolute: 0 10*3/uL (ref 0.0–0.2)
Basos: 0 %
EOS (ABSOLUTE): 0.1 10*3/uL (ref 0.0–0.4)
Eos: 2 %
Hematocrit: 39.9 % (ref 34.0–46.6)
Hemoglobin: 13.3 g/dL (ref 11.1–15.9)
Immature Grans (Abs): 0 10*3/uL (ref 0.0–0.1)
Immature Granulocytes: 0 %
Lymphocytes Absolute: 2 10*3/uL (ref 0.7–3.1)
Lymphs: 22 %
MCH: 26.8 pg (ref 26.6–33.0)
MCHC: 33.3 g/dL (ref 31.5–35.7)
MCV: 80 fL (ref 79–97)
Monocytes Absolute: 0.4 10*3/uL (ref 0.1–0.9)
Monocytes: 4 %
Neutrophils Absolute: 6.3 10*3/uL (ref 1.4–7.0)
Neutrophils: 72 %
Platelets: 294 10*3/uL (ref 150–450)
RBC: 4.97 x10E6/uL (ref 3.77–5.28)
RDW: 13.5 % (ref 11.7–15.4)
WBC: 8.9 10*3/uL (ref 3.4–10.8)

## 2021-06-02 LAB — HEMOGLOBIN A1C
Est. average glucose Bld gHb Est-mCnc: 120 mg/dL
Hgb A1c MFr Bld: 5.8 % — ABNORMAL HIGH (ref 4.8–5.6)

## 2021-06-02 LAB — VITAMIN D 25 HYDROXY (VIT D DEFICIENCY, FRACTURES): Vit D, 25-Hydroxy: 28.6 ng/mL — ABNORMAL LOW (ref 30.0–100.0)

## 2021-06-10 ENCOUNTER — Other Ambulatory Visit: Payer: Self-pay | Admitting: Family Medicine

## 2021-06-10 DIAGNOSIS — Z206 Contact with and (suspected) exposure to human immunodeficiency virus [HIV]: Secondary | ICD-10-CM

## 2021-06-10 NOTE — Telephone Encounter (Signed)
Requested medication (s) are due for refill today: yes to both  Requested medication (s) are on the active medication list: yes  Last refill:  05/18/21 for both  Future visit scheduled: no  Notes to clinic:  both meds not assigned to a protocol   Requested Prescriptions  Pending Prescriptions Disp Refills   ISENTRESS 400 MG tablet [Pharmacy Med Name: ISENTRESS 400MG  TABLETS] 56 tablet 0    Sig: TAKE 1 TABLET BY MOUTH TWICE DAILY      Off-Protocol Failed - 06/10/2021 10:33 AM      Failed - Medication not assigned to a protocol, review manually.      Passed - Valid encounter within last 12 months    Recent Outpatient Visits           2 weeks ago Prophylactic measure   [facility], MD   3 months ago Encounter for oral contraception initial prescription   [facility] M, M   10 months ago Mild episode of recurrent major depressive disorder [facility])   [facility] M, M   11 months ago Primary insomnia   [facility] M, M   11 months ago Annual physical exam   [facility] M, M                  emtricitabine-tenofovir (TRUVADA) 200-300 MG tablet [Pharmacy Med Name: EMTRICITABINE/TENOF 200-300MG  TABS] 28 tablet 0    Sig: TAKE 1 TABLET BY MOUTH EVERY DAY      Off-Protocol Failed - 06/10/2021 10:33 AM      Failed - Medication not assigned to a protocol, review manually.      Passed - Valid encounter within last 12 months    Recent Outpatient Visits           2 weeks ago Prophylactic measure   [facility], MD   3 months ago Encounter for oral contraception initial prescription   [facility] M, M   10 months ago Mild episode of recurrent major depressive disorder [facility])   [facility] M, M   11 months  ago Primary insomnia   [facility] M, M   11 months ago Annual physical exam   [facility], Margaretann Loveless

## 2021-06-13 ENCOUNTER — Other Ambulatory Visit: Payer: Self-pay | Admitting: Family Medicine

## 2021-06-13 DIAGNOSIS — Z206 Contact with and (suspected) exposure to human immunodeficiency virus [HIV]: Secondary | ICD-10-CM

## 2021-06-13 NOTE — Telephone Encounter (Signed)
Requested medication (s) are due for refill today: yes  Requested medication (s) are on the active medication list: yes  Last refill: 05/18/21  # 56 0 refills  Future visit scheduled no  Notes to clinic   off protocol  Requested Prescriptions  Pending Prescriptions Disp Refills   ISENTRESS 400 MG tablet [Pharmacy Med Name: ISENTRESS 400MG  TABLETS] 56 tablet 0    Sig: TAKE 1 TABLET BY MOUTH TWICE DAILY      Off-Protocol Failed - 06/13/2021  4:40 PM      Failed - Medication not assigned to a protocol, review manually.      Passed - Valid encounter within last 12 months    Recent Outpatient Visits           2 weeks ago Prophylactic measure   [facility], MD   3 months ago Encounter for oral contraception initial prescription   [facility] M, M   10 months ago Mild episode of recurrent major depressive disorder [facility])   [facility] M, M   11 months ago Primary insomnia   [facility] M, M   11 months ago Annual physical exam   [facility], Margaretann Loveless

## 2021-06-29 ENCOUNTER — Other Ambulatory Visit: Payer: Self-pay | Admitting: Family Medicine

## 2021-06-29 ENCOUNTER — Encounter: Payer: Self-pay | Admitting: Family Medicine

## 2021-06-29 DIAGNOSIS — Z299 Encounter for prophylactic measures, unspecified: Secondary | ICD-10-CM

## 2021-06-29 DIAGNOSIS — Z206 Contact with and (suspected) exposure to human immunodeficiency virus [HIV]: Secondary | ICD-10-CM

## 2021-07-12 LAB — COMPREHENSIVE METABOLIC PANEL
ALT: 23 IU/L (ref 0–32)
AST: 17 IU/L (ref 0–40)
Albumin/Globulin Ratio: 1.2 (ref 1.2–2.2)
Albumin: 3.7 g/dL — ABNORMAL LOW (ref 3.8–4.8)
Alkaline Phosphatase: 67 IU/L (ref 44–121)
BUN/Creatinine Ratio: 14 (ref 9–23)
BUN: 14 mg/dL (ref 6–24)
Bilirubin Total: 0.4 mg/dL (ref 0.0–1.2)
CO2: 23 mmol/L (ref 20–29)
Calcium: 9 mg/dL (ref 8.7–10.2)
Chloride: 101 mmol/L (ref 96–106)
Creatinine, Ser: 0.99 mg/dL (ref 0.57–1.00)
Globulin, Total: 3 g/dL (ref 1.5–4.5)
Glucose: 134 mg/dL — ABNORMAL HIGH (ref 65–99)
Potassium: 4.2 mmol/L (ref 3.5–5.2)
Sodium: 139 mmol/L (ref 134–144)
Total Protein: 6.7 g/dL (ref 6.0–8.5)
eGFR: 73 mL/min/{1.73_m2} (ref >59)

## 2021-07-12 LAB — CBC
Hematocrit: 41.7 % (ref 34.0–46.6)
Hemoglobin: 13.4 g/dL (ref 11.1–15.9)
MCH: 26.3 pg — ABNORMAL LOW (ref 26.6–33.0)
MCHC: 32.1 g/dL (ref 31.5–35.7)
MCV: 82 fL (ref 79–97)
Platelets: 290 10*3/uL (ref 150–450)
RBC: 5.1 x10E6/uL (ref 3.77–5.28)
RDW: 14.3 % (ref 11.7–15.4)
WBC: 7.3 10*3/uL (ref 3.4–10.8)

## 2021-07-12 LAB — HIV ANTIBODY (ROUTINE TESTING W REFLEX): HIV Screen 4th Generation wRfx: NONREACTIVE

## 2022-02-09 ENCOUNTER — Other Ambulatory Visit: Payer: Self-pay

## 2022-02-09 ENCOUNTER — Encounter: Payer: Self-pay | Admitting: Family Medicine

## 2022-02-09 ENCOUNTER — Ambulatory Visit: Payer: BC Managed Care – PPO | Admitting: Family Medicine

## 2022-02-09 VITALS — BP 158/82 | HR 98 | Temp 97.6°F | Resp 16 | Wt 363.0 lb

## 2022-02-09 DIAGNOSIS — R110 Nausea: Secondary | ICD-10-CM | POA: Diagnosis not present

## 2022-02-09 DIAGNOSIS — E282 Polycystic ovarian syndrome: Secondary | ICD-10-CM

## 2022-02-09 DIAGNOSIS — Z3201 Encounter for pregnancy test, result positive: Secondary | ICD-10-CM

## 2022-02-09 LAB — POCT URINE PREGNANCY: Preg Test, Ur: NEGATIVE

## 2022-02-09 MED ORDER — ONDANSETRON 8 MG PO TBDP: 8.0000 mg | ORAL_TABLET | Freq: Three times a day (TID) | ORAL | 0 refills | Status: DC | PRN

## 2022-02-09 NOTE — Assessment & Plan Note (Signed)
Likely d/t hormones from presumed pregnancy ?UTD zofran provided ?Encouraged bland diet ?

## 2022-02-09 NOTE — Assessment & Plan Note (Signed)
Date of intercourse 1/17 ?In office negative, will f/u with blood tests ?Wishes to have an abortion at a hospital  ?Referral placed  ?

## 2022-02-09 NOTE — Assessment & Plan Note (Signed)
BMI 59.49 ?Discussed importance of healthy weight management ?Discussed diet and exercise ? ?

## 2022-02-09 NOTE — Assessment & Plan Note (Signed)
Hx of PCOS; has been on OCPs ?

## 2022-02-09 NOTE — Progress Notes (Signed)
?  ? ? ?I,Sulibeya S Dimas,acting as a scribe for Gwyneth Sprout, FNP.,have documented all relevant documentation on the behalf of Gwyneth Sprout, FNP,as directed by  Gwyneth Sprout, FNP while in the presence of Gwyneth Sprout, FNP. ? ?Established patient visit ? ? ?Patient: Alexis Miles   DOB: 10-26-1976   46 y.o. Female  MRN: [id number] ?Visit Date: 02/09/2022 ? ?Today's healthcare provider: Gwyneth Sprout, FNP  ?Introduced to Designer, jewellery role and practice setting.  All questions answered.  Discussed provider/patient relationship and expectations. ? ? ?Chief Complaint  ?Patient presents with  ? Possible Pregnancy  ? ?Subjective  ?  ?HPI  ?Patient here to confirm pregnancy. Patient reports she had unprotected sex on 12/26/21. Patient reports that the next day she had her period. She reports blood was brown not usual red. Period lasted 4-5 days. Patient reports she did home test and it was positive.  ? ?Medications: ?Outpatient Medications Prior to Visit  ?Medication Sig  ? acetaminophen (TYLENOL) 500 MG tablet Take 500 mg by mouth every 6 (six) hours as needed for mild pain or headache.   ? busPIRone (BUSPAR) 10 MG tablet TAKE 1 TABLET(10 MG) BY MOUTH TWICE DAILY  ? metFORMIN (GLUCOPHAGE) 500 MG tablet Take 1 tablet (500 mg total) by mouth daily with breakfast.  ? norethindrone (CAMILA) 0.35 MG tablet Take 1 tablet (0.35 mg total) by mouth daily. Take continuous  ? ramelteon (ROZEREM) 8 MG tablet Take 1 tablet (8 mg total) by mouth at bedtime.  ? sertraline (ZOLOFT) 100 MG tablet TAKE 1 TABLET(100 MG) BY MOUTH DAILY  ? Vitamin D, Ergocalciferol, (DRISDOL) 1.25 MG (50000 UNIT) CAPS capsule Take 1 capsule (50,000 Units total) by mouth every 7 (seven) days.  ? [DISCONTINUED] emtricitabine-tenofovir (TRUVADA) 200-300 MG tablet Take 1 tablet by mouth daily. (Patient not taking: Reported on 02/09/2022)  ? [DISCONTINUED] raltegravir (ISENTRESS) 400 MG tablet Take 1 tablet (400 mg total) by mouth 2 (two) times daily.  (Patient not taking: Reported on 02/09/2022)  ? ?No facility-administered medications prior to visit.  ? ? ?Review of Systems  ?Gastrointestinal:  Positive for nausea.  ? ?Last CBC ?Lab Results  ?Component Value Date  ? WBC 7.3 07/11/2021  ? HGB 13.4 07/11/2021  ? HCT 41.7 07/11/2021  ? MCV 82 07/11/2021  ? MCH 26.3 (L) 07/11/2021  ? RDW 14.3 07/11/2021  ? PLT 290 07/11/2021  ? ?Last metabolic panel ?Lab Results  ?Component Value Date  ? GLUCOSE 134 (H) 07/11/2021  ? NA 139 07/11/2021  ? K 4.2 07/11/2021  ? CL 101 07/11/2021  ? CO2 23 07/11/2021  ? BUN 14 07/11/2021  ? CREATININE 0.99 07/11/2021  ? EGFR 73 07/11/2021  ? CALCIUM 9.0 07/11/2021  ? PROT 6.7 07/11/2021  ? ALBUMIN 3.7 (L) 07/11/2021  ? LABGLOB 3.0 07/11/2021  ? AGRATIO 1.2 07/11/2021  ? BILITOT 0.4 07/11/2021  ? ALKPHOS 67 07/11/2021  ? AST 17 07/11/2021  ? ALT 23 07/11/2021  ? ANIONGAP 8 07/22/2018  ? ?Last thyroid functions ?Lab Results  ?Component Value Date  ? TSH 1.440 06/24/2020  ? ?  ?  Objective  ?  ?BP (!) 158/82 (BP Location: Left Arm, Patient Position: Sitting, Cuff Size: Large)   Pulse 98   Temp 97.6 ?F (36.4 ?C) (Temporal)   Resp 16   Wt (!) 363 lb (164.7 kg)   LMP 12/27/2021 (Exact Date)   BMI 59.49 kg/m?  ?BP Readings from Last 3 Encounters:  ?02/09/22 Marland Kitchen)  158/82  ?05/26/21 115/74  ?02/24/21 (!) 148/70  ? ?Wt Readings from Last 3 Encounters:  ?02/09/22 (!) 363 lb (164.7 kg)  ?05/26/21 (!) 351 lb (159.2 kg)  ?02/24/21 (!) 352 lb 3.2 oz (159.8 kg)  ? ?  ? ?Physical Exam ?Vitals and nursing note reviewed.  ?Constitutional:   ?   General: She is not in acute distress. ?   Appearance: Normal appearance. She is obese. She is not ill-appearing, toxic-appearing or diaphoretic.  ?HENT:  ?   Head: Normocephalic and atraumatic.  ?Cardiovascular:  ?   Rate and Rhythm: Normal rate and regular rhythm.  ?   Pulses: Normal pulses.  ?   Heart sounds: Normal heart sounds. No murmur heard. ?  No friction rub. No gallop.  ?Pulmonary:  ?   Effort:  Pulmonary effort is normal. No respiratory distress.  ?   Breath sounds: Normal breath sounds. No stridor. No wheezing, rhonchi or rales.  ?Chest:  ?   Chest wall: No tenderness.  ?Abdominal:  ?   General: Bowel sounds are normal.  ?   Palpations: Abdomen is soft.  ?Musculoskeletal:     ?   General: No swelling, tenderness, deformity or signs of injury. Normal range of motion.  ?   Right lower leg: No edema.  ?   Left lower leg: No edema.  ?Skin: ?   General: Skin is warm and dry.  ?   Capillary Refill: Capillary refill takes less than 2 seconds.  ?   Coloration: Skin is not jaundiced or pale.  ?   Findings: No bruising, erythema, lesion or rash.  ?Neurological:  ?   General: No focal deficit present.  ?   Mental Status: She is alert and oriented to person, place, and time. Mental status is at baseline.  ?   Cranial Nerves: No cranial nerve deficit.  ?   Sensory: No sensory deficit.  ?   Motor: No weakness.  ?   Coordination: Coordination normal.  ?Psychiatric:     ?   Mood and Affect: Mood normal.     ?   Behavior: Behavior normal.     ?   Thought Content: Thought content normal.     ?   Judgment: Judgment normal.  ?  ? ?Results for orders placed or performed in visit on 02/09/22  ?POCT urine pregnancy  ?Result Value Ref Range  ? Preg Test, Ur Negative Negative  ? ? Assessment & Plan  ?  ? ?Problem List Items Addressed This Visit   ? ?  ? Endocrine  ? PCOS (polycystic ovarian syndrome)  ?  Hx of PCOS; has been on OCPs ?  ?  ?  ? Other  ? Morbid obesity ([place])  ?  BMI 59.49 ?Discussed importance of healthy weight management ?Discussed diet and exercise ? ?  ?  ? Nausea  ?  Likely d/t hormones from presumed pregnancy ?UTD zofran provided ?Encouraged bland diet ?  ?  ? Relevant Medications  ? ondansetron (ZOFRAN-ODT) 8 MG disintegrating tablet  ? Other Relevant Orders  ? POCT urine pregnancy (Completed)  ? Positive pregnancy test - Primary  ?  Date of intercourse 1/17 ?In office negative, will f/u with blood  tests ?Wishes to have an abortion at a hospital  ?Referral placed  ?  ?  ? Relevant Orders  ? Ambulatory referral to Obstetrics / Gynecology  ? hCG, serum, qualitative  ? hCG, quantitative, pregnancy  ? ? ? ?Return for annual examination.  ?   ? ?  Vonna Kotyk, FNP, have reviewed all documentation for this visit. The documentation on 02/09/22 for the exam, diagnosis, procedures, and orders are all accurate and complete. ? ? ? ?Gwyneth Sprout, FNP  ?[place] ?[phone] (phone) ?[phone] (fax) ? ?[facility] ?

## 2022-02-10 LAB — BETA HCG QUANT (REF LAB): hCG Quant: <1 m[IU]/mL

## 2022-02-10 LAB — HCG, SERUM, QUALITATIVE

## 2022-02-12 ENCOUNTER — Encounter: Payer: Self-pay | Admitting: Family Medicine

## 2022-02-26 ENCOUNTER — Other Ambulatory Visit: Payer: Self-pay | Admitting: Family Medicine

## 2022-02-26 DIAGNOSIS — E282 Polycystic ovarian syndrome: Secondary | ICD-10-CM

## 2022-02-27 ENCOUNTER — Other Ambulatory Visit: Payer: Self-pay

## 2022-02-27 ENCOUNTER — Ambulatory Visit
Admission: RE | Admit: 2022-02-27 | Discharge: 2022-02-27 | Disposition: A | Discharge status: Home / Self Care | Payer: BC Managed Care – PPO | Source: Ambulatory Visit | Attending: Family Medicine | Admitting: Family Medicine

## 2022-02-27 DIAGNOSIS — E282 Polycystic ovarian syndrome: Secondary | ICD-10-CM | POA: Diagnosis present

## 2022-05-02 ENCOUNTER — Encounter: Payer: Self-pay | Admitting: Family Medicine

## 2022-05-02 ENCOUNTER — Ambulatory Visit (INDEPENDENT_AMBULATORY_CARE_PROVIDER_SITE_OTHER): Payer: BC Managed Care – PPO | Admitting: Family Medicine

## 2022-05-02 VITALS — BP 145/92 | HR 74 | Temp 98.1°F | Resp 16 | Ht 65.5 in | Wt 340.0 lb

## 2022-05-02 DIAGNOSIS — N926 Irregular menstruation, unspecified: Secondary | ICD-10-CM

## 2022-05-02 DIAGNOSIS — F32A Depression, unspecified: Secondary | ICD-10-CM

## 2022-05-02 DIAGNOSIS — E282 Polycystic ovarian syndrome: Secondary | ICD-10-CM

## 2022-05-02 DIAGNOSIS — R030 Elevated blood-pressure reading, without diagnosis of hypertension: Secondary | ICD-10-CM

## 2022-05-02 DIAGNOSIS — Z0000 Encounter for general adult medical examination without abnormal findings: Secondary | ICD-10-CM | POA: Insufficient documentation

## 2022-05-02 DIAGNOSIS — E559 Vitamin D deficiency, unspecified: Secondary | ICD-10-CM

## 2022-05-02 DIAGNOSIS — F419 Anxiety disorder, unspecified: Secondary | ICD-10-CM

## 2022-05-02 DIAGNOSIS — R739 Hyperglycemia, unspecified: Secondary | ICD-10-CM

## 2022-05-02 DIAGNOSIS — Z1231 Encounter for screening mammogram for malignant neoplasm of breast: Secondary | ICD-10-CM

## 2022-05-02 DIAGNOSIS — Z30011 Encounter for initial prescription of contraceptive pills: Secondary | ICD-10-CM | POA: Diagnosis not present

## 2022-05-02 MED ORDER — NORETHINDRONE 0.35 MG PO TABS: 1.0000 | ORAL_TABLET | Freq: Every day | ORAL | 4 refills | Status: DC

## 2022-05-02 MED ORDER — SERTRALINE HCL 25 MG PO TABS: ORAL_TABLET | ORAL | 3 refills | Status: DC

## 2022-05-02 MED ORDER — METFORMIN HCL 500 MG PO TABS: 500.0000 mg | ORAL_TABLET | Freq: Every day | ORAL | 3 refills | Status: DC

## 2022-05-02 NOTE — Patient Instructions (Signed)
The 10-year ASCVD risk score (Arnett DK, et al., 2019) is: 1.2%   Values used to calculate the score:     Age: 46 years     Sex: Female     Is Non-Hispanic African American: No     Diabetic: No     Tobacco smoker: No     Systolic Blood Pressure: 145 mmHg     Is BP treated: No     HDL Cholesterol: 45 mg/dL     Total Cholesterol: 196 mg/dL

## 2022-05-02 NOTE — Assessment & Plan Note (Signed)
Due for COVID booster Due for mammogram UTD on vision UTD on dental Things to do to keep yourself healthy  - Exercise at least 30-45 minutes a day, 3-4 days a week.  - Eat a low-fat diet with lots of fruits and vegetables, up to 7-9 servings per day.  - Seatbelts can save your life. Wear them always.  - Smoke detectors on every level of your home, check batteries every year.  - Eye Doctor - have an eye exam every 1-2 years  - Safe sex - if you may be exposed to STDs, use a condom.  - Alcohol -  If you drink, do it moderately, less than 2 drinks per day.  - [place]. Choose someone to speak for you if you are not able.  - Depression is common in our stressful world.If you're feeling down or losing interest in things you normally enjoy, please come in for a visit.  - Violence - If anyone is threatening or hurting you, please call immediately.

## 2022-05-02 NOTE — Assessment & Plan Note (Signed)
Repeat Vit D levels; currently off supplement

## 2022-05-02 NOTE — Assessment & Plan Note (Signed)
Repeat A1c; hx of borderline prediabetes Continue to recommend balanced, lower carb meals. Smaller meal size, adding snacks. Choosing water as drink of choice and increasing purposeful exercise.

## 2022-05-02 NOTE — Assessment & Plan Note (Signed)
Chronic, recent exacerbations since March Off medications due to lack of supply Will restart SSRI slowly 8 week follow up encouraged Will hold buspar at this time Denies sleep aid necessity  Contracted to safety- denies SI or HI

## 2022-05-02 NOTE — Assessment & Plan Note (Addendum)
Chronic, stable Period irregularities continue Wishes to restart Metformin 500 mg to assist

## 2022-05-02 NOTE — Assessment & Plan Note (Signed)
Due for screening for mammogram, denies breast concerns, provided with phone number to call and schedule appointment for mammogram. Encouraged to repeat breast cancer screening every 1-2 years.  

## 2022-05-02 NOTE — Assessment & Plan Note (Signed)
Acute x2 occurrences, reported stress at workplace this morning Advised use of medication to assist with BP control at 2 month follow up in July 2023

## 2022-05-02 NOTE — Assessment & Plan Note (Signed)
Wishes to re-start OCPs to assist with PCOS

## 2022-05-02 NOTE — Assessment & Plan Note (Signed)
Chronic, improved Weight loss of 20+ lbs Discussed importance of healthy weight management Discussed diet and exercise

## 2022-05-02 NOTE — Progress Notes (Signed)
Alexis Miles,Alexis Miles,acting as a Education administrator for Alexis Sprout, FNP.,have documented all relevant documentation on the behalf of Alexis Sprout, FNP,as directed by  Alexis Sprout, FNP while in the presence of Alexis Sprout, FNP.  Complete physical exam  Patient: Alexis Miles   DOB: 1976/06/20   46 y.o. Female  MRN: [id number] Visit Date: 05/02/2022  Today's healthcare provider: Gwyneth Sprout, FNP  Re Introduced to nurse practitioner role and practice setting.  All questions answered.  Discussed provider/patient relationship and expectations.  Chief Complaint  Patient presents with   Annual Exam   Subjective    Alexis Miles is a 46 y.o. female who presents today for a complete physical exam.  She reports consuming a general diet. The patient does not participate in regular exercise at present. She generally feels well. She reports sleeping fairly well. She does have additional problems to discuss today.  HPI   Past Medical History:  Diagnosis Date   Anxiety    PCOS (polycystic ovarian syndrome)    Past Surgical History:  Procedure Laterality Date   BARTHOLIN GLAND CYST EXCISION     MOUTH SURGERY     wisdom teeth removed   Social History   Socioeconomic History   Marital status: Single    Spouse name: Not on file   Number of children: 0   Years of education: College   Highest education level: Bachelor's degree (e.g., BA, AB, BS)  Occupational History   Occupation: Full Time    Employer: [employer]    Comment: [employer]  Tobacco Use   Smoking status: Never   Smokeless tobacco: Never  Vaping Use   Vaping Use: Never used  Substance and Sexual Activity   Alcohol use: Not on file   Drug use: No   Sexual activity: Not on file  Other Topics Concern   Not on file  Social History Narrative   Not on file   Social Determinants of Health   Financial Resource Strain: Not on file  Food Insecurity: Not on file  Transportation Needs: Not on file   Physical Activity: Not on file  Stress: Not on file  Social Connections: Not on file  Intimate Partner Violence: Not on file   Family Status  Relation Name Status   Mother  Deceased   Father  Alive   Sister  Alive   MGM  Deceased   PGF  Deceased       MI   Sister  Alive   Programmer, systems  (Not Specified)   Family History  Problem Relation Age of Onset   Arthritis Mother    Lymphoma Mother    Heart disease Father        MI, CABG, CAD   Diabetes Father    Hyperlipidemia Sister    Depression Sister    Anxiety disorder Sister    Cancer Maternal Grandmother        colon, breast    Diabetes Maternal Grandmother    Breast cancer Maternal Grandmother 57   Alcohol abuse Paternal Grandfather    Hypertension Sister    Depression Sister    Anxiety disorder Sister    Diabetes Maternal Aunt    Lymphoma Maternal Aunt    Allergies  Allergen Reactions   Penicillins Other (See Comments)    Has patient had a PCN reaction causing immediate rash, facial/tongue/throat swelling, SOB or lightheadedness with hypotension: Unknown Has patient had a PCN reaction causing severe  rash involving mucus membranes or skin necrosis: Unknown Has patient had a PCN reaction that required hospitalization: Unknown Has patient had a PCN reaction occurring within the last 10 years: Unknown If all of the above answers are "NO", then may proceed with Cephalosporin use.    Trazodone And Nefazodone Other (See Comments)    Suicidal Ideation    Patient Care Team: Alexis Sprout, FNP as PCP - General (Family Medicine) Birdie Sons, MD as Referring Physician (Family Medicine)   Medications: Outpatient Medications Prior to Visit  Medication Sig   [DISCONTINUED] acetaminophen (TYLENOL) 500 MG tablet Take 500 mg by mouth every 6 (six) hours as needed for mild pain or headache.    [DISCONTINUED] busPIRone (BUSPAR) 10 MG tablet TAKE 1 TABLET(10 MG) BY MOUTH TWICE DAILY   [DISCONTINUED] metFORMIN (GLUCOPHAGE) 500 MG  tablet Take 1 tablet (500 mg total) by mouth daily with breakfast.   [DISCONTINUED] norethindrone (CAMILA) 0.35 MG tablet Take 1 tablet (0.35 mg total) by mouth daily. Take continuous   [DISCONTINUED] ramelteon (ROZEREM) 8 MG tablet Take 1 tablet (8 mg total) by mouth at bedtime.   [DISCONTINUED] sertraline (ZOLOFT) 100 MG tablet TAKE 1 TABLET(100 MG) BY MOUTH DAILY   [DISCONTINUED] Vitamin D, Ergocalciferol, (DRISDOL) 1.25 MG (50000 UNIT) CAPS capsule Take 1 capsule (50,000 Units total) by mouth every 7 (seven) days.   [DISCONTINUED] ondansetron (ZOFRAN-ODT) 8 MG disintegrating tablet Take 1 tablet (8 mg total) by mouth every 8 (eight) hours as needed for nausea or vomiting.   No facility-administered medications prior to visit.    Review of Systems  Gastrointestinal:  Positive for abdominal pain, diarrhea and nausea.  Musculoskeletal:  Positive for back pain.  Psychiatric/Behavioral:  Positive for sleep disturbance. The patient is nervous/anxious.   All other systems reviewed and are negative.  Last CBC Lab Results  Component Value Date   WBC 7.3 07/11/2021   HGB 13.4 07/11/2021   HCT 41.7 07/11/2021   MCV 82 07/11/2021   MCH 26.3 (L) 07/11/2021   RDW 14.3 07/11/2021   PLT 290 25/95/6387   Last metabolic panel Lab Results  Component Value Date   GLUCOSE 134 (H) 07/11/2021   NA 139 07/11/2021   K 4.2 07/11/2021   CL 101 07/11/2021   CO2 23 07/11/2021   BUN 14 07/11/2021   CREATININE 0.99 07/11/2021   EGFR 73 07/11/2021   CALCIUM 9.0 07/11/2021   PROT 6.7 07/11/2021   ALBUMIN 3.7 (L) 07/11/2021   LABGLOB 3.0 07/11/2021   AGRATIO 1.2 07/11/2021   BILITOT 0.4 07/11/2021   ALKPHOS 67 07/11/2021   AST 17 07/11/2021   ALT 23 07/11/2021   ANIONGAP 8 07/22/2018   Last lipids Lab Results  Component Value Date   CHOL 196 06/24/2020   HDL 45 06/24/2020   LDLCALC 127 (H) 06/24/2020   TRIG 132 06/24/2020   CHOLHDL 4.4 06/24/2020   Last hemoglobin A1c Lab Results   Component Value Date   HGBA1C 5.8 (H) 06/01/2021   Last thyroid functions Lab Results  Component Value Date   TSH 1.440 06/24/2020   Last vitamin D Lab Results  Component Value Date   VD25OH 28.6 (L) 06/01/2021      Objective     BP (!) 145/92 (BP Location: Left Wrist, Patient Position: Sitting, Cuff Size: Large)   Pulse 74   Temp 98.1 F (36.7 C) (Oral)   Resp 16   Ht 5' 5.5" (1.664 m)   Wt (!) 340 lb (154.2 kg)  LMP 04/23/2022 (Approximate) Comment: spotting only  SpO2 100%   BMI 55.72 kg/m   BP Readings from Last 3 Encounters:  05/02/22 (!) 145/92  02/09/22 (!) 158/82  05/26/21 115/74   Wt Readings from Last 3 Encounters:  05/02/22 (!) 340 lb (154.2 kg)  02/09/22 (!) 363 lb (164.7 kg)  05/26/21 (!) 351 lb (159.2 kg)   Physical Exam   Last depression screening scores    05/02/2022    9:58 AM 02/09/2022    1:46 PM 05/26/2021    1:20 PM  PHQ 2/9 Scores  PHQ - 2 Score 0 3 1  PHQ- 9 Score _0 Last fall risk screening    05/02/2022    9:57 AM  Fall Risk   Falls in the past year? 0  Number falls in past yr: 0  Injury with Fall? 0  Risk for fall due to : No Fall Risks  Follow up Falls evaluation completed   Last Audit-C alcohol use screening    05/02/2022    9:58 AM  Alcohol Use Disorder Test (AUDIT)  1. How often do you have a drink containing alcohol? 1  2. How many drinks containing alcohol do you have on a typical day when you are drinking? 0  3. How often do you have six or more drinks on one occasion? 0  AUDIT-C Score 1   A score of 3 or more in women, and 4 or more in men indicates increased risk for alcohol abuse, EXCEPT if all of the points are from question 1   No results found for any visits on 05/02/22.  Assessment & Plan    Routine Health Maintenance and Physical Exam  Exercise Activities and Dietary recommendations  Goals   None     Immunization History  Administered Date(s) Administered   Influenza Split 10/01/2013    Influenza,inj,Quad PF,6+ Mos 12/28/2015, 09/05/2018, 08/30/2021   Influenza-Unspecified 10/17/2016, 10/03/2017, 09/28/2020   Moderna Sars-Covid-2 Vaccination 02/06/2020, 03/05/2020   Tdap 06/27/2018    Health Maintenance  Topic Date Due   COVID-19 Vaccine (3 - Moderna risk series) 04/02/2020   INFLUENZA VACCINE  07/10/2022   PAP SMEAR-Modifier  06/25/2023   TETANUS/TDAP  06/27/2028   Hepatitis C Screening  Completed   HPV VACCINES  Aged Out   HIV Screening  Discontinued    Discussed health benefits of physical activity, and encouraged her to engage in regular exercise appropriate for her age and condition.  Problem List Items Addressed This Visit       Endocrine   PCOS (polycystic ovarian syndrome)    Chronic, stable Period irregularities continue Wishes to restart Metformin 500 mg to assist       Relevant Medications   metFORMIN (GLUCOPHAGE) 500 MG tablet     Other   Annual physical exam - Primary    Due for COVID booster Due for mammogram UTD on vision UTD on dental Things to do to keep yourself healthy  - Exercise at least 30-45 minutes a day, 3-4 days a week.  - Eat a low-fat diet with lots of fruits and vegetables, up to 7-9 servings per day.  - Seatbelts can save your life. Wear them always.  - Smoke detectors on every level of your home, check batteries every year.  - Eye Doctor - have an eye exam every 1-2 years  - Safe sex - if you may be exposed to STDs, use a condom.  - Alcohol -  If you drink,  do it moderately, less than 2 drinks per day.  - [place]. Choose someone to speak for you if you are not able.  - Depression is common in our stressful world.If you're feeling down or losing interest in things you normally enjoy, please come in for a visit.  - Violence - If anyone is threatening or hurting you, please call immediately.         Relevant Orders   Comprehensive metabolic panel   Lipid Panel With LDL/HDL Ratio   Anxiety and  depression    Chronic, recent exacerbations since March Off medications due to lack of supply Will restart SSRI slowly 8 week follow up encouraged Will hold buspar at this time Denies sleep aid necessity  Contracted to safety- denies SI or HI       Relevant Medications   sertraline (ZOLOFT) 25 MG tablet   Avitaminosis D    Repeat Vit D levels; currently off supplement       Relevant Orders   VITAMIN D 25 Hydroxy (Vit-D Deficiency, Fractures)   Blood glucose elevated    Repeat A1c; hx of borderline prediabetes Continue to recommend balanced, lower carb meals. Smaller meal size, adding snacks. Choosing water as drink of choice and increasing purposeful exercise.        Relevant Medications   metFORMIN (GLUCOPHAGE) 500 MG tablet   Other Relevant Orders   Hemoglobin A1c   Elevated blood pressure reading without diagnosis of hypertension    Acute x2 occurrences, reported stress at workplace this morning Advised use of medication to assist with BP control at 2 month follow up in July 2023       Encounter for oral contraception initial prescription    Wishes to re-start OCPs to assist with PCOS       Relevant Medications   norethindrone (CAMILA) 0.35 MG tablet   Encounter for screening mammogram for malignant neoplasm of breast    Due for screening for mammogram, denies breast concerns, provided with phone number to call and schedule appointment for mammogram. Encouraged to repeat breast cancer screening every 1-2 years.        Relevant Orders   MM 3D SCREEN BREAST BILATERAL   Irregular periods    Multifocal discussion behind cause of irregular periods including but not limited to morbid obesity, recent positive pregnancy test, PCOS, perimenopause, etc Will get basic hormone labs Will refer to GYN if needed       Relevant Orders   TSH + free T4   FSH/LH   Prolactin   Morbid obesity (HCC)    Chronic, improved Weight loss of 20+ lbs Discussed importance of  healthy weight management Discussed diet and exercise       Relevant Medications   metFORMIN (GLUCOPHAGE) 500 MG tablet   Other Relevant Orders   Comprehensive metabolic panel   Lipid Panel With LDL/HDL Ratio   Hemoglobin A1c   TSH + free T4   Return in about 2 months (around 07/02/2022) for HTN management, anxiety and depression.    Vonna Kotyk, FNP, have reviewed all documentation for this visit. The documentation on 05/02/22 for the exam, diagnosis, procedures, and orders are all accurate and complete.  Alexis Miles, [place] [phone] (phone) [phone] (fax)  [place]

## 2022-05-02 NOTE — Assessment & Plan Note (Signed)
Multifocal discussion behind cause of irregular periods including but not limited to morbid obesity, recent positive pregnancy test, PCOS, perimenopause, etc Will get basic hormone labs Will refer to GYN if needed

## 2022-05-03 ENCOUNTER — Other Ambulatory Visit: Payer: Self-pay | Admitting: Family Medicine

## 2022-05-03 DIAGNOSIS — E559 Vitamin D deficiency, unspecified: Secondary | ICD-10-CM

## 2022-05-03 LAB — COMPREHENSIVE METABOLIC PANEL
ALT: 27 IU/L (ref 0–32)
AST: 22 IU/L (ref 0–40)
Albumin/Globulin Ratio: 1.4 (ref 1.2–2.2)
Albumin: 4.1 g/dL (ref 3.8–4.8)
Alkaline Phosphatase: 72 IU/L (ref 44–121)
BUN/Creatinine Ratio: 15 (ref 9–23)
BUN: 14 mg/dL (ref 6–24)
Bilirubin Total: 0.5 mg/dL (ref 0.0–1.2)
CO2: 24 mmol/L (ref 20–29)
Calcium: 9.4 mg/dL (ref 8.7–10.2)
Chloride: 96 mmol/L (ref 96–106)
Creatinine, Ser: 0.93 mg/dL (ref 0.57–1.00)
Globulin, Total: 3 g/dL (ref 1.5–4.5)
Glucose: 93 mg/dL (ref 70–99)
Potassium: 4.3 mmol/L (ref 3.5–5.2)
Sodium: 140 mmol/L (ref 134–144)
Total Protein: 7.1 g/dL (ref 6.0–8.5)
eGFR: 78 mL/min/{1.73_m2} (ref >59)

## 2022-05-03 LAB — VITAMIN D 25 HYDROXY (VIT D DEFICIENCY, FRACTURES): Vit D, 25-Hydroxy: 20.4 ng/mL — ABNORMAL LOW (ref 30.0–100.0)

## 2022-05-03 LAB — LIPID PANEL WITH LDL/HDL RATIO
Cholesterol, Total: 198 mg/dL (ref 100–199)
HDL: 50 mg/dL (ref >39)
LDL Chol Calc (NIH): 126 mg/dL — ABNORMAL HIGH (ref 0–99)
LDL/HDL Ratio: 2.5 ratio (ref 0.0–3.2)
Triglycerides: 122 mg/dL (ref 0–149)
VLDL Cholesterol Cal: 22 mg/dL (ref 5–40)

## 2022-05-03 LAB — FSH/LH
FSH: 5.7 m[IU]/mL
LH: 5.9 m[IU]/mL

## 2022-05-03 LAB — TSH+FREE T4
Free T4: 1.13 ng/dL (ref 0.82–1.77)
TSH: 2.01 u[IU]/mL (ref 0.450–4.500)

## 2022-05-03 LAB — HEMOGLOBIN A1C
Est. average glucose Bld gHb Est-mCnc: 117 mg/dL
Hgb A1c MFr Bld: 5.7 % — ABNORMAL HIGH (ref 4.8–5.6)

## 2022-05-03 LAB — PROLACTIN: Prolactin: 25.2 ng/mL — ABNORMAL HIGH (ref 4.8–23.3)

## 2022-05-03 MED ORDER — VITAMIN D (ERGOCALCIFEROL) 1.25 MG (50000 UNIT) PO CAPS: 50000.0000 [IU] | ORAL_CAPSULE | ORAL | 3 refills | Status: DC

## 2022-05-04 ENCOUNTER — Other Ambulatory Visit: Payer: Self-pay | Admitting: Family Medicine

## 2022-05-04 ENCOUNTER — Encounter: Payer: Self-pay | Admitting: Obstetrics

## 2022-05-04 ENCOUNTER — Ambulatory Visit: Payer: BC Managed Care – PPO | Admitting: Obstetrics

## 2022-05-04 ENCOUNTER — Encounter: Payer: Self-pay | Admitting: Family Medicine

## 2022-05-04 VITALS — BP 126/80 | HR 88 | Resp 16 | Ht 65.5 in | Wt 341.0 lb

## 2022-05-04 DIAGNOSIS — E282 Polycystic ovarian syndrome: Secondary | ICD-10-CM

## 2022-05-04 DIAGNOSIS — R110 Nausea: Secondary | ICD-10-CM | POA: Diagnosis not present

## 2022-05-04 DIAGNOSIS — R7303 Prediabetes: Secondary | ICD-10-CM

## 2022-05-04 DIAGNOSIS — Z6841 Body Mass Index (BMI) 40.0 and over, adult: Secondary | ICD-10-CM

## 2022-05-04 DIAGNOSIS — R7989 Other specified abnormal findings of blood chemistry: Secondary | ICD-10-CM | POA: Diagnosis not present

## 2022-05-04 DIAGNOSIS — N939 Abnormal uterine and vaginal bleeding, unspecified: Secondary | ICD-10-CM

## 2022-05-04 LAB — POCT URINE PREGNANCY: Preg Test, Ur: NEGATIVE

## 2022-05-04 NOTE — Progress Notes (Signed)
Chief Complaint  Patient presents with   Nausea    Patient comes in office today with concerns of nausea.  Patient states that she last  had unprotected sex with partner in January, and states that symptoms of nausea persisted. She states in March she took urine pregnancy test which was positive but states that blood work confirmed she was not pregnant. Patient states that in April she noticed menstrual cycle was irregular and appeared to be lighter than usual. Patient states that she has a history of PCOS and concerns that she is pregnant.    Patient Alexis Miles is an [place] Patient's last menstrual period was 04/23/2022 (approximate). currently minipill for contraception who presents for the complaint of abnormal uterine bleeding. She has history of PCOS with regular periods on OCPs. She has only ever has unprotected intercourse once in January. She had following been experiencing nausea and increased under arm sweat. States she had a positive UPT March 10 with test reporting "pregnant." Follow up labs showed neg urine and serum pregnancy. UPT neg today. Patient states She has severe anxiety and is worried she can be pregnant. S/p [place] as below. Has had light bleeding more spotting and thick heavy discharge. History of migraines but have improved since she was younger. No specific change in vision except as getting older she now has to wear readers. Pt lost from 363 lbs.  Recent glucocorticoid use: no  She reports some bleeding gums with brushing teeth.  She denies easy bleeding or bruising. There is no family history of blood disorders. She is sexually active with no problems. She is exercising irregularly. She does perform self breast exam. She takes  multivitamin. She denies vasomotor symptoms, or vaginal dryness.  Ultrasound: CLINICAL DATA:  Lack of menses. Longstanding history of polycystic ovarian syndrome.   EXAM: TRANSABDOMINAL AND TRANSVAGINAL ULTRASOUND OF PELVIS   TECHNIQUE: Both  transabdominal and transvaginal ultrasound examinations of the pelvis were performed. Transabdominal technique was performed for global imaging of the pelvis including uterus, ovaries, adnexal regions, and pelvic cul-de-sac. It was necessary to proceed with endovaginal exam following the transabdominal exam to visualize the uterus, ovaries, and adnexa.   COMPARISON:  None   FINDINGS: Uterus   Measurements: 6.8 x 3.4 x 5.2 cm = volume: 62.5 mL. No fibroids or other mass visualized.   Endometrium   Thickness: 6 mm within the fundus. Heterogeneous hypoechogenicity with vascularity in the lower uterine segment measures 11 mm, including on image 64.   Right ovary   Measurements: 2.0 x 1.4 x 2.1 cm = volume: 3.4 ML. Normal appearance/no adnexal mass.   Left ovary   Measurements: 1.9 x 2.1 x 2.8 cm = volume: 5.8 mL. Normal appearance/no adnexal mass. Only identified transabdominally.   Other findings   No abnormal free fluid.   IMPRESSION: 1. No explanation for lack of menses. Normal endometrial thickness superiorly. Heterogeneous hypoechogenicity within the endometrium of the lower uterine segment. Most likely within normal variation in this 46 year old patient without a reported history of excessive bleeding. 2. Normal appearance of the ovaries.   Follows with primary care provider: Gwyneth Sprout, FNP  Review of Systems  Constitutional:  Positive for fatigue and unexpected weight change. Negative for activity change, appetite change, chills, diaphoresis and fever.  HENT:  Negative for congestion, dental problem, drooling, ear discharge, ear pain, facial swelling, hearing loss, mouth sores, nosebleeds, postnasal drip, rhinorrhea, sinus pressure, sinus pain, sneezing, sore throat, tinnitus, trouble swallowing and voice change.  Eyes:  Positive for visual disturbance. Negative for photophobia, pain, discharge, redness and itching.  Respiratory:  Negative for apnea, cough,  choking, chest tightness, shortness of breath, wheezing and stridor.   Cardiovascular:  Negative for chest pain, palpitations and leg swelling.  Gastrointestinal:  Negative for abdominal distention, abdominal pain, anal bleeding, blood in stool, constipation, diarrhea, nausea, rectal pain and vomiting.  Endocrine: Negative for cold intolerance, heat intolerance, polydipsia, polyphagia and polyuria.  Genitourinary:  Positive for vaginal discharge. Negative for decreased urine volume, difficulty urinating, dyspareunia, dysuria, enuresis, flank pain, frequency, genital sores, hematuria, menstrual problem, pelvic pain, urgency, vaginal bleeding and vaginal pain.  Musculoskeletal:  Negative for arthralgias, back pain, gait problem, joint swelling, myalgias, neck pain and neck stiffness.  Skin:  Negative for color change, pallor, rash and wound.  Allergic/Immunologic: Negative for environmental allergies, food allergies and immunocompromised state.  Neurological:  Negative for dizziness, tremors, seizures, syncope, facial asymmetry, speech difficulty, weakness, light-headedness, numbness and headaches.  Hematological:  Negative for adenopathy. Does not bruise/bleed easily.  Psychiatric/Behavioral:  Negative for agitation, behavioral problems, confusion, decreased concentration, dysphoric mood, hallucinations, self-injury, sleep disturbance and suicidal ideas. The patient is not nervous/anxious and is not hyperactive.    Gynecologic History: Menarche: 13 Period Pattern: (!) Irregular Menstrual Flow: Light Dysmenorrhea: None History of PCOS  She denies abnormal paps. She denies pelvic infections. She denies domestic violence or sexual abuse. Patient did not receive [place] Maintenance  Topic Date Due   COVID-19 Vaccine (3 - Moderna risk series) 04/02/2020   INFLUENZA VACCINE  07/10/2022   PAP SMEAR-Modifier  06/25/2023   TETANUS/TDAP  06/27/2028   Hepatitis C Screening  Completed   HPV  VACCINES  Aged Out   HIV Screening  Discontinued   Past Medical History:  Diagnosis Date   Anxiety    PCOS (polycystic ovarian syndrome)    Past Surgical History:  Procedure Laterality Date   BARTHOLIN GLAND CYST EXCISION     MOUTH SURGERY     wisdom teeth removed   Family History  Problem Relation Age of Onset   Arthritis Mother    Lymphoma Mother    Heart disease Father        MI, CABG, CAD   Diabetes Father    Hyperlipidemia Sister    Depression Sister    Anxiety disorder Sister    Cancer Maternal Grandmother        colon, breast    Diabetes Maternal Grandmother    Breast cancer Maternal Grandmother 4   Alcohol abuse Paternal Grandfather    Hypertension Sister    Depression Sister    Anxiety disorder Sister    Diabetes Maternal Aunt    Lymphoma Maternal Aunt    Social History   Socioeconomic History   Marital status: Single    Spouse name: Not on file   Number of children: 0   Years of education: College   Highest education level: Bachelor's degree (e.g., BA, AB, BS)  Occupational History   Occupation: Full Time    Employer: [employer]    Comment: [employer]  Tobacco Use   Smoking status: Never   Smokeless tobacco: Never  Vaping Use   Vaping Use: Never used  Substance and Sexual Activity   Alcohol use: Not on file   Drug use: No   Sexual activity: Not on file  Other Topics Concern   Not on file  Social History Narrative   Not on file   Social Determinants  of Health   Financial Resource Strain: Not on file  Food Insecurity: Not on file  Transportation Needs: Not on file  Physical Activity: Not on file  Stress: Not on file  Social Connections: Not on file  Intimate Partner Violence: Not on file    Medicine list and allergies reviewed and updated.    Objective:  BP 126/80   Pulse 88   Resp 16   Ht 5' 5.5" (1.664 m)   Wt (!) 341 lb (154.7 kg)   LMP 04/23/2022 (Approximate) Comment: spotting only  SpO2 98%   BMI 55.88  kg/m  Physical Exam Vitals and nursing note reviewed.  Constitutional:      Appearance: Normal appearance. She is obese.  HENT:     Head: Normocephalic and atraumatic.  Eyes:     Extraocular Movements: Extraocular movements intact.  Pulmonary:     Effort: Pulmonary effort is normal.  Musculoskeletal:        General: Normal range of motion.     Cervical back: Normal range of motion.  Skin:    General: Skin is warm and dry.  Neurological:     General: No focal deficit present.     Mental Status: She is alert and oriented to person, place, and time.  Psychiatric:        Mood and Affect: Mood normal.        Behavior: Behavior normal.        Thought Content: Thought content normal.   Assessment:  Nausea - Plan: POCT urine pregnancy  Elevated prolactin level - Plan: Prolactin  Prediabetes  PCOS (polycystic ovarian syndrome)  Abnormal uterine bleeding (AUB)  BMI 50.0-59.9, adult ([place])  Plan: We have reviewed labs. UPT neg, pt not pregnant, reassurance given. We did discuss that bleeding on OCPs may get lighter over time. She is on progesterone only pill.  On review of records pt with no history of diagnosis of HTN but there have been elevated BP on several occasions. BP stable today.  [place] images reviewed which appear wnl. Can consider follow up in 3 mo to document stability. We have discussed the elevated prolactin level. Recommend rpt lab in one week fasting with no breast stimulation x 1 week. If still elevated further eval needed. Pt with prediabetes on metformin - recommend discussing meds such as wegovy with PCP. All other labs reviewed reassuring.  The 10-year ASCVD risk score (Arnett DK, et al., 2019) is: 0.8%   Values used to calculate the score:     Age: 78 years     Sex: Female     Is Non-Hispanic African American: No     Diabetic: No     Tobacco smoker: No     Systolic Blood Pressure: 123XX123 mmHg     Is BP treated: No     HDL Cholesterol: 50 mg/dL     Total  Cholesterol: 198 mg/dL  Return if symptoms worsen or fail to improve.

## 2022-05-04 NOTE — Patient Instructions (Signed)
Have a great year! Please call with any concerns. Don't forget to wear your seatbelt everyday! If you are not signed up on MyChart, please ask [place] how to sign up for it!   In a world where you can be anything, please be kind.   Body mass index is 55.88 kg/m.  A Healthy Lifestyle: Care Instructions Your Care Instructions  A healthy lifestyle can help you feel good, stay at a healthy weight, and have plenty of energy for both work and play. A healthy lifestyle is something you can share with your whole family. A healthy lifestyle also can lower your risk for serious health problems, such as high blood pressure, heart disease, and diabetes. You can follow a few steps listed below to improve your health and the health of your family. Follow-up care is a key part of your treatment and safety. Be sure to make and go to all appointments, and call your doctor if you are having problems. It's also a good idea to know your test results and keep a list of the medicines you take. How can you care for yourself at home? Do not eat too much sugar, fat, or fast foods. You can still have dessert and treats now and then. The goal is moderation. Start small to improve your eating habits. Pay attention to portion sizes, drink less juice and soda pop, and eat more fruits and vegetables. Eat a healthy amount of food. A 3-ounce serving of meat, for example, is about the size of a deck of cards. Fill the rest of your plate with vegetables and whole grains. Limit the amount of soda and sports drinks you have every day. Drink more water when you are thirsty. Eat at least 5 servings of fruits and vegetables every day. It may seem like a lot, but it is not hard to reach this goal. A serving or helping is 1 piece of fruit, 1 cup of vegetables, or 2 cups of leafy, raw vegetables. Have an apple or some carrot sticks as an afternoon snack instead of a candy bar. Try to have fruits and/or vegetables at every meal. Make exercise  part of your daily routine. You may want to start with simple activities, such as walking, bicycling, or slow swimming. Try to be active 30 to 60 minutes every day. You do not need to do all 30 to 60 minutes all at once. For example, you can exercise 3 times a day for 10 or 20 minutes. Moderate exercise is safe for most people, but it is always a good idea to talk to your doctor before starting an exercise program. Keep moving. Mow the lawn, work in the garden, or [employer]. Take the stairs instead of the elevator at work. If you smoke, quit. People who smoke have an increased risk for heart attack, stroke, cancer, and other lung illnesses. Quitting is hard, but there are ways to boost your chance of quitting tobacco for good. Use nicotine gum, patches, or lozenges. Ask your doctor about stop-smoking programs and medicines. Keep trying. In addition to reducing your risk of diseases in the future, you will notice some benefits soon after you stop using tobacco. If you have shortness of breath or asthma symptoms, they will likely get better within a few weeks after you quit. Limit how much alcohol you drink. Moderate amounts of alcohol (up to 2 drinks a day for men, 1 drink a day for women) are okay. But drinking too much can lead to  liver problems, high blood pressure, and other health problems. Family health If you have a family, there are many things you can do together to improve your health. Eat meals together as a family as often as possible. Eat healthy foods. This includes fruits, vegetables, lean meats and dairy, and whole grains. Include your family in your fitness plan. Most people think of activities such as jogging or tennis as the way to fitness, but there are many ways you and your family can be more active. Anything that makes you breathe hard and gets your heart pumping is exercise. Here are some tips: Walk to do errands or to take your child to school or the bus. Go for a family  bike ride after dinner instead of watching TV. Care instructions adapted under license by your healthcare professional. This care instruction is for use with your licensed healthcare professional. If you have questions about a medical condition or this instruction, always ask your healthcare professional. [place] any warranty or liability for your use of this information.

## 2022-05-11 ENCOUNTER — Other Ambulatory Visit: Payer: BC Managed Care – PPO

## 2022-05-11 DIAGNOSIS — R7989 Other specified abnormal findings of blood chemistry: Secondary | ICD-10-CM

## 2022-05-12 LAB — PROLACTIN: Prolactin: 36.1 ng/mL — ABNORMAL HIGH (ref 4.8–23.3)

## 2022-05-14 ENCOUNTER — Encounter: Payer: Self-pay | Admitting: Obstetrics

## 2022-05-14 DIAGNOSIS — R7989 Other specified abnormal findings of blood chemistry: Secondary | ICD-10-CM

## 2022-05-14 DIAGNOSIS — N921 Excessive and frequent menstruation with irregular cycle: Secondary | ICD-10-CM

## 2022-05-14 DIAGNOSIS — T8331XD Breakdown (mechanical) of intrauterine contraceptive device, subsequent encounter: Secondary | ICD-10-CM

## 2022-05-24 ENCOUNTER — Ambulatory Visit: Payer: BC Managed Care – PPO

## 2022-05-29 ENCOUNTER — Ambulatory Visit
Admission: RE | Admit: 2022-05-29 | Discharge: 2022-05-29 | Disposition: A | Discharge status: Home / Self Care | Payer: BC Managed Care – PPO | Source: Ambulatory Visit | Attending: Family Medicine | Admitting: Family Medicine

## 2022-05-29 DIAGNOSIS — Z1231 Encounter for screening mammogram for malignant neoplasm of breast: Secondary | ICD-10-CM | POA: Insufficient documentation

## 2022-05-30 NOTE — Progress Notes (Signed)
Hi Lynn  Normal mammogram; repeat in 1 year.  Please let us know if you have any questions.  Thank you,  Graceland Wachter, FNP 

## 2022-06-14 ENCOUNTER — Ambulatory Visit
Admission: RE | Admit: 2022-06-14 | Discharge: 2022-06-14 | Disposition: A | Discharge status: Home / Self Care | Payer: BC Managed Care – PPO | Source: Ambulatory Visit | Attending: Obstetrics | Admitting: Obstetrics

## 2022-06-14 DIAGNOSIS — N921 Excessive and frequent menstruation with irregular cycle: Secondary | ICD-10-CM | POA: Diagnosis present

## 2022-06-14 DIAGNOSIS — R7989 Other specified abnormal findings of blood chemistry: Secondary | ICD-10-CM | POA: Diagnosis present

## 2022-06-28 ENCOUNTER — Telehealth: Payer: Self-pay

## 2022-06-28 NOTE — Telephone Encounter (Signed)
Pt aware by detailed msg.  Sent to [facility] for scheduling.

## 2022-06-28 NOTE — Telephone Encounter (Signed)
I contacted patient via phone. Left voicemail for patient to call back to be scheduled with DJE

## 2022-07-02 ENCOUNTER — Ambulatory Visit: Payer: BC Managed Care – PPO | Admitting: Family Medicine

## 2022-08-30 ENCOUNTER — Encounter: Payer: Self-pay | Admitting: Obstetrics and Gynecology

## 2022-08-30 ENCOUNTER — Ambulatory Visit: Payer: BC Managed Care – PPO | Admitting: Obstetrics and Gynecology

## 2022-08-30 VITALS — BP 126/84 | Ht 65.5 in | Wt 354.0 lb

## 2022-08-30 DIAGNOSIS — E221 Hyperprolactinemia: Secondary | ICD-10-CM | POA: Diagnosis not present

## 2022-08-30 DIAGNOSIS — N939 Abnormal uterine and vaginal bleeding, unspecified: Secondary | ICD-10-CM | POA: Diagnosis not present

## 2022-08-30 DIAGNOSIS — Z6841 Body Mass Index (BMI) 40.0 and over, adult: Secondary | ICD-10-CM

## 2022-08-30 DIAGNOSIS — E282 Polycystic ovarian syndrome: Secondary | ICD-10-CM

## 2022-08-30 MED ORDER — CABERGOLINE 0.5 MG PO TABS: 0.2500 mg | ORAL_TABLET | ORAL | 2 refills | Status: AC

## 2022-08-30 NOTE — Progress Notes (Signed)
Patient presents today for surgery consult for hysteroscopy. She states stopping her daily OCP in July, has had minimal spotting once a month if any. Patient is curious of ultrasound results and next steps.

## 2022-08-30 NOTE — Progress Notes (Signed)
HPI:      [name] is a 46 y.o. G0P0000 who LMP was Patient's last menstrual period was 08/18/2022 (approximate).  Subjective:   She presents today for consultation/discussion regarding lower uterine segment abnormality on ultrasound, PCO, elevated prolactin, and irregular menstrual cycles. She says that she was on OCPs but quit a few months ago.  She has not had menstrual period since that time.  She says that over the course of her life there have been times where she has not had a menstrual period for a full year. She has insulin resistance based on hemoglobin A1c-likely part of her PCO picture. She has been found to have an elevated prolactin and has experienced breast tenderness and feelings of "possibly being pregnant".  Pregnancy test are negative. Ultrasound reveals some cystic-like structure in the lower uterine segment which has been consistent over the last 4 months.  In a woman not bleeding one of the radiologist calls this a normal variant.     Hx: The following portions of the patient's history were reviewed and updated as appropriate:             She  has a past medical history of Anxiety and PCOS (polycystic ovarian syndrome). She does not have any pertinent problems on file. She  has a past surgical history that includes Mouth surgery and Bartholin gland cyst excision. Her family history includes Alcohol abuse in her paternal grandfather; Anxiety disorder in her sister and sister; Arthritis in her mother; Breast cancer (age of onset: 50) in her maternal grandmother; Cancer in her maternal grandmother; Depression in her sister and sister; Diabetes in her father, maternal aunt, and maternal grandmother; Heart disease in her father; Hyperlipidemia in her sister; Hypertension in her sister; Lymphoma in her maternal aunt and mother. She  reports that she has never smoked. She has never used smokeless tobacco. She reports that she does not use drugs. No history on file for  alcohol use. She has a current medication list which includes the following prescription(s): cabergoline, metformin, sertraline, and vitamin d (ergocalciferol). She is allergic to penicillins and trazodone and nefazodone.       Review of Systems:  Review of Systems  Constitutional: Denied constitutional symptoms, night sweats, recent illness, fatigue, fever, insomnia and weight loss.  Eyes: Denied eye symptoms, eye pain, photophobia, vision change and visual disturbance.  Ears/Nose/Throat/Neck: Denied ear, nose, throat or neck symptoms, hearing loss, nasal discharge, sinus congestion and sore throat.  Cardiovascular: Denied cardiovascular symptoms, arrhythmia, chest pain/pressure, edema, exercise intolerance, orthopnea and palpitations.  Respiratory: Denied pulmonary symptoms, asthma, pleuritic pain, productive sputum, cough, dyspnea and wheezing.  Gastrointestinal: Denied, gastro-esophageal reflux, melena, nausea and vomiting.  Genitourinary: See HPI for additional information.  Musculoskeletal: Denied musculoskeletal symptoms, stiffness, swelling, muscle weakness and myalgia.  Dermatologic: Denied dermatology symptoms, rash and scar.  Neurologic: Denied neurology symptoms, dizziness, headache, neck pain and syncope.  Psychiatric: Denied psychiatric symptoms, anxiety and depression.  Endocrine: See HPI for additional information.   Meds:   Current Outpatient Medications on File Prior to Visit  Medication Sig Dispense Refill   metFORMIN (GLUCOPHAGE) 500 MG tablet Take 1 tablet (500 mg total) by mouth daily with breakfast. 90 tablet 3   sertraline (ZOLOFT) 25 MG tablet Titrate 25 mg daily for 1-2 weeks, based on tolerance Titrate to 50 mg, two tablets, for next 1-2 weeks, based on tolerance Increased to 75 mg, three tablets for 1-2 weeks following, based on tolerance. Previous dose was 100  mg; can increase to 100 mg, 4 tablets, if needed. Recommend office visit after 6-8 weeks. Tolerance  based on symptoms as well as repeat PHQ2/9 and GAD7 screening 90 tablet 3   Vitamin D, Ergocalciferol, (DRISDOL) 1.25 MG (50000 UNIT) CAPS capsule Take 1 capsule (50,000 Units total) by mouth every 7 (seven) days. 13 capsule 3   No current facility-administered medications on file prior to visit.      Objective:     Vitals:   08/30/22 1410  BP: 126/84   Filed Weights   08/30/22 1410  Weight: (!) 354 lb (160.6 kg)              Labs and ultrasounds reviewed directly with the patient          Assessment:    G0P0000 Patient Active Problem List   Diagnosis Date Noted   Irregular periods 05/02/2022   Encounter for oral contraception initial prescription 05/02/2022   Encounter for screening mammogram for malignant neoplasm of breast 05/02/2022   Anxiety and depression 05/02/2022   Elevated blood pressure reading without diagnosis of hypertension 05/02/2022   Annual physical exam 05/02/2022   Positive pregnancy test 02/09/2022   Morbid obesity ([place]) 05/04/2016   Blood glucose elevated 12/28/2015   Migraine 12/28/2015   PCOS (polycystic ovarian syndrome) 12/28/2015   Avitaminosis D 12/28/2015     1. Abnormal uterine bleeding (AUB)   2. Hyperprolactinemia ([place])   3. PCO (polycystic ovaries)   4. Morbid obesity with BMI of 50.0-59.9, adult ([place])     Strongly doubt patient has endometrial hyperplasia although she does have some risk factors including obesity PCO.  Stable appearance of mass in lower uterine segment.  Patient not bleeding.   Plan:            1.  We have discussed multiple management options.  We have chosen together endometrial biopsy for evaluation of lower uterine segment -patient would like to avoid surgery if possible.  Mirena IUD in the future for endometrial protection.  Dostinex to lower prolactin.  Orders No orders of the defined types were placed in this encounter.    Meds ordered this encounter  Medications   cabergoline (DOSTINEX) 0.5 MG  tablet    Sig: Take 0.5 tablets (0.25 mg total) by mouth 2 (two) times a week for 60 doses.    Dispense:  10 tablet    Refill:  2      F/U  Return in about 2 weeks (around 09/13/2022). I spent 41 minutes involved in the care of this patient preparing to see the patient by obtaining and reviewing her medical history (including labs, imaging tests and prior procedures), documenting clinical information in the electronic health record (EHR), counseling and coordinating care plans, writing and sending prescriptions, ordering tests or procedures and in direct communicating with the patient and medical staff discussing pertinent items from her history and physical exam.  Finis Bud, M.D. 08/30/2022 3:28 PM

## 2022-08-30 NOTE — Addendum Note (Signed)
Addended by: Finis Bud on: 08/30/2022 04:45 PM   Modules accepted: Level of Service

## 2022-09-19 ENCOUNTER — Encounter: Payer: Self-pay | Admitting: Obstetrics and Gynecology

## 2022-09-19 ENCOUNTER — Other Ambulatory Visit (HOSPITAL_COMMUNITY)
Admission: RE | Admit: 2022-09-19 | Discharge: 2022-09-19 | Disposition: A | Discharge status: Home / Self Care | Payer: BC Managed Care – PPO | Source: Ambulatory Visit | Attending: Obstetrics and Gynecology | Admitting: Obstetrics and Gynecology

## 2022-09-19 ENCOUNTER — Ambulatory Visit: Payer: BC Managed Care – PPO | Admitting: Obstetrics and Gynecology

## 2022-09-19 DIAGNOSIS — E282 Polycystic ovarian syndrome: Secondary | ICD-10-CM | POA: Insufficient documentation

## 2022-09-19 DIAGNOSIS — N939 Abnormal uterine and vaginal bleeding, unspecified: Secondary | ICD-10-CM | POA: Diagnosis present

## 2022-09-19 DIAGNOSIS — E221 Hyperprolactinemia: Secondary | ICD-10-CM | POA: Insufficient documentation

## 2022-09-19 DIAGNOSIS — N840 Polyp of corpus uteri: Secondary | ICD-10-CM

## 2022-09-19 NOTE — Progress Notes (Signed)
Patient presents today for an endometrial biopsy. She states no additional questions today.

## 2022-09-19 NOTE — Progress Notes (Signed)
HPI:      Alexis Miles is a 46 y.o. G0P0000 who LMP was Patient's last menstrual period was 08/18/2022 (approximate).  Subjective:   She presents today with history of irregular bleeding and a possible abnormality noted on ultrasound.  She is here for endometrial biopsy. She also desires IUD placement for management of her PCO.    Hx: The following portions of the patient's history were reviewed and updated as appropriate:             She  has a past medical history of Anxiety and PCOS (polycystic ovarian syndrome). She does not have any pertinent problems on file. She  has a past surgical history that includes Mouth surgery and Bartholin gland cyst excision. Her family history includes Alcohol abuse in her paternal grandfather; Anxiety disorder in her sister and sister; Arthritis in her mother; Breast cancer (age of onset: 53) in her maternal grandmother; Cancer in her maternal grandmother; Depression in her sister and sister; Diabetes in her father, maternal aunt, and maternal grandmother; Heart disease in her father; Hyperlipidemia in her sister; Hypertension in her sister; Lymphoma in her maternal aunt and mother. She  reports that she has never smoked. She has never used smokeless tobacco. She reports that she does not currently use alcohol. She reports that she does not use drugs. She has a current medication list which includes the following prescription(s): cabergoline, metformin, sertraline, and vitamin d (ergocalciferol). She is allergic to penicillins and trazodone and nefazodone.       Review of Systems:  Review of Systems  Constitutional: Denied constitutional symptoms, night sweats, recent illness, fatigue, fever, insomnia and weight loss.  Eyes: Denied eye symptoms, eye pain, photophobia, vision change and visual disturbance.  Ears/Nose/Throat/Neck: Denied ear, nose, throat or neck symptoms, hearing loss, nasal discharge, sinus congestion and sore throat.   Cardiovascular: Denied cardiovascular symptoms, arrhythmia, chest pain/pressure, edema, exercise intolerance, orthopnea and palpitations.  Respiratory: Denied pulmonary symptoms, asthma, pleuritic pain, productive sputum, cough, dyspnea and wheezing.  Gastrointestinal: Denied, gastro-esophageal reflux, melena, nausea and vomiting.  Genitourinary: Denied genitourinary symptoms including symptomatic vaginal discharge, pelvic relaxation issues, and urinary complaints.  Musculoskeletal: Denied musculoskeletal symptoms, stiffness, swelling, muscle weakness and myalgia.  Dermatologic: Denied dermatology symptoms, rash and scar.  Neurologic: Denied neurology symptoms, dizziness, headache, neck pain and syncope.  Psychiatric: Denied psychiatric symptoms, anxiety and depression.  Endocrine: Denied endocrine symptoms including hot flashes and night sweats.   Meds:   Current Outpatient Medications on File Prior to Visit  Medication Sig Dispense Refill   cabergoline (DOSTINEX) 0.5 MG tablet Take 0.5 tablets (0.25 mg total) by mouth 2 (two) times a week for 60 doses. 10 tablet 2   metFORMIN (GLUCOPHAGE) 500 MG tablet Take 1 tablet (500 mg total) by mouth daily with breakfast. 90 tablet 3   sertraline (ZOLOFT) 25 MG tablet Titrate 25 mg daily for 1-2 weeks, based on tolerance Titrate to 50 mg, two tablets, for next 1-2 weeks, based on tolerance Increased to 75 mg, three tablets for 1-2 weeks following, based on tolerance. Previous dose was 100 mg; can increase to 100 mg, 4 tablets, if needed. Recommend office visit after 6-8 weeks. Tolerance based on symptoms as well as repeat PHQ2/9 and GAD7 screening 90 tablet 3   Vitamin D, Ergocalciferol, (DRISDOL) 1.25 MG (50000 UNIT) CAPS capsule Take 1 capsule (50,000 Units total) by mouth every 7 (seven) days. 13 capsule 3   No current facility-administered medications on file prior to visit.  Objective:     There were no vitals filed for this  visit. There were no vitals filed for this visit.            Physical examination   Pelvic:   Vulva: Normal appearance.  No lesions.  Vagina: No lesions or abnormalities noted.  Support: Normal pelvic support.  Urethra No masses tenderness or scarring.  Meatus Normal size without lesions or prolapse.  Cervix: Normal appearance.  No lesions.  Anus: Normal exam.  No lesions.  Perineum: Normal exam.  No lesions.        Bimanual   Uterus: Normal size.  Non-tender.  Mobile.  AV.  Adnexae: No masses.  Non-tender to palpation.  Cul-de-sac: Negative for abnormality.   Endometrial Biopsy After discussion with the patient regarding her abnormal uterine bleeding I recommended that she proceed with an endometrial biopsy for further diagnosis. The risks, benefits, alternatives, and indications for an endometrial biopsy were discussed with the patient in detail. She understood the risks including infection, bleeding, cervical laceration and uterine perforation.  Verbal consent was obtained.   PROCEDURE NOTE:  Vacurette endometrial biopsy was performed using aseptic technique with iodine preparation.  The uterus was sounded to a length of 7 cm.  Adequate sampling was obtained with minimal blood loss.  The patient tolerated the procedure well.  Scant tissue obtained although I am certain I was in the endometrial cavity.  Disposition will be pending pathology           Assessment:    G0P0000 Patient Active Problem List   Diagnosis Date Noted   Irregular periods 05/02/2022   Encounter for oral contraception initial prescription 05/02/2022   Encounter for screening mammogram for malignant neoplasm of breast 05/02/2022   Anxiety and depression 05/02/2022   Elevated blood pressure reading without diagnosis of hypertension 05/02/2022   Annual physical exam 05/02/2022   Positive pregnancy test 02/09/2022   Morbid obesity ([place]) 05/04/2016   Blood glucose elevated 12/28/2015   Migraine 12/28/2015    PCOS (polycystic ovarian syndrome) 12/28/2015   Avitaminosis D 12/28/2015     1. Abnormal uterine bleeding (AUB)   2. Hyperprolactinemia ([place])   3. PCO (polycystic ovaries)     Possible endometrial abnormality on ultrasound   Plan:            1.  Endometrial biopsy performed  2.  Recommend waiting for results before placing IUD.  I do suspect her endometrial biopsy will return as normal.  Plan follow-up in approximately 2 weeks for IUD placement. Orders No orders of the defined types were placed in this encounter.   No orders of the defined types were placed in this encounter.     F/U  No follow-ups on file. I spent 22 minutes involved in the care of this patient preparing to see the patient by obtaining and reviewing her medical history (including labs, imaging tests and prior procedures), documenting clinical information in the electronic health record (EHR), counseling and coordinating care plans, writing and sending prescriptions, ordering tests or procedures and in direct communicating with the patient and medical staff discussing pertinent items from her history and physical exam.  Finis Bud, M.D. 09/19/2022 3:58 PM

## 2022-09-21 ENCOUNTER — Encounter: Payer: Self-pay | Admitting: Obstetrics and Gynecology

## 2022-09-21 LAB — SURGICAL PATHOLOGY

## 2022-10-07 NOTE — Progress Notes (Signed)
Alexis Miles: Please touch base with her. Her ultrasound shows an endometrial polyp and she may have irregular bleeding even with the IUD.  If she has essentially "normal" bleeding with her IUD nothing further to do.  If her bleeding is irregular would consider D&C with hysteroscopy.

## 2022-10-16 ENCOUNTER — Ambulatory Visit: Payer: BC Managed Care – PPO | Admitting: Obstetrics and Gynecology

## 2022-10-16 ENCOUNTER — Encounter: Payer: Self-pay | Admitting: Obstetrics and Gynecology

## 2022-10-16 VITALS — BP 134/86 | HR 71 | Ht 65.5 in | Wt 351.8 lb

## 2022-10-16 DIAGNOSIS — Z30430 Encounter for insertion of intrauterine contraceptive device: Secondary | ICD-10-CM | POA: Diagnosis not present

## 2022-10-16 DIAGNOSIS — Z3202 Encounter for pregnancy test, result negative: Secondary | ICD-10-CM

## 2022-10-16 LAB — POCT URINE PREGNANCY: Preg Test, Ur: NEGATIVE

## 2022-10-16 MED ORDER — LEVONORGESTREL 20 MCG/DAY IU IUD
1.0000 | INTRAUTERINE_SYSTEM | Freq: Once | INTRAUTERINE | Status: AC
  Administered 2022-10-16: 1 via INTRAUTERINE

## 2022-10-16 NOTE — Addendum Note (Signed)
Addended by: Douglass Rivers R on: 10/16/2022 04:12 PM   Modules accepted: Orders

## 2022-10-16 NOTE — Progress Notes (Signed)
HPI:      [name] is a 46 y.o. G0P0000 who LMP was Patient's last menstrual period was 10/16/2022.  Subjective:   She presents today for IUD insertion.  She has irregular heavy bleeding.  Endometrial biopsy was negative for malignancy but showed possibility of endometrial polyp.  Rather than go to the operating room patient has decided upon IUD to see if it controls her bleeding.  She is aware that this is not standard treatment for a polyp and it may not cause her bleeding to resolve.  If she continues to experience heavy bleeding would then recommend D&C.  She would like to try the IUD first.    Hx: The following portions of the patient's history were reviewed and updated as appropriate:             She  has a past medical history of Anxiety and PCOS (polycystic ovarian syndrome). She does not have any pertinent problems on file. She  has a past surgical history that includes Mouth surgery and Bartholin gland cyst excision. Her family history includes Alcohol abuse in her paternal grandfather; Anxiety disorder in her sister and sister; Arthritis in her mother; Breast cancer (age of onset: 48) in her maternal grandmother; Cancer in her maternal grandmother; Depression in her sister and sister; Diabetes in her father, maternal aunt, and maternal grandmother; Heart disease in her father; Hyperlipidemia in her sister; Hypertension in her sister; Lymphoma in her maternal aunt and mother. She  reports that she has never smoked. She has never used smokeless tobacco. She reports that she does not currently use alcohol. She reports that she does not use drugs. She has a current medication list which includes the following prescription(s): cabergoline, metformin, sertraline, and vitamin d (ergocalciferol). She is allergic to penicillins and trazodone and nefazodone.       Review of Systems:  Review of Systems  Constitutional: Denied constitutional symptoms, night sweats, recent illness,  fatigue, fever, insomnia and weight loss.  Eyes: Denied eye symptoms, eye pain, photophobia, vision change and visual disturbance.  Ears/Nose/Throat/Neck: Denied ear, nose, throat or neck symptoms, hearing loss, nasal discharge, sinus congestion and sore throat.  Cardiovascular: Denied cardiovascular symptoms, arrhythmia, chest pain/pressure, edema, exercise intolerance, orthopnea and palpitations.  Respiratory: Denied pulmonary symptoms, asthma, pleuritic pain, productive sputum, cough, dyspnea and wheezing.  Gastrointestinal: Denied, gastro-esophageal reflux, melena, nausea and vomiting.  Genitourinary: See HPI for additional information.  Musculoskeletal: Denied musculoskeletal symptoms, stiffness, swelling, muscle weakness and myalgia.  Dermatologic: Denied dermatology symptoms, rash and scar.  Neurologic: Denied neurology symptoms, dizziness, headache, neck pain and syncope.  Psychiatric: Denied psychiatric symptoms, anxiety and depression.  Endocrine: Denied endocrine symptoms including hot flashes and night sweats.   Meds:   Current Outpatient Medications on File Prior to Visit  Medication Sig Dispense Refill   cabergoline (DOSTINEX) 0.5 MG tablet Take 0.5 tablets (0.25 mg total) by mouth 2 (two) times a week for 60 doses. 10 tablet 2   metFORMIN (GLUCOPHAGE) 500 MG tablet Take 1 tablet (500 mg total) by mouth daily with breakfast. 90 tablet 3   sertraline (ZOLOFT) 25 MG tablet Titrate 25 mg daily for 1-2 weeks, based on tolerance Titrate to 50 mg, two tablets, for next 1-2 weeks, based on tolerance Increased to 75 mg, three tablets for 1-2 weeks following, based on tolerance. Previous dose was 100 mg; can increase to 100 mg, 4 tablets, if needed. Recommend office visit after 6-8 weeks. Tolerance based on symptoms as well  as repeat PHQ2/9 and GAD7 screening 90 tablet 3   Vitamin D, Ergocalciferol, (DRISDOL) 1.25 MG (50000 UNIT) CAPS capsule Take 1 capsule (50,000 Units total) by mouth  every 7 (seven) days. 13 capsule 3   No current facility-administered medications on file prior to visit.    Objective:     Vitals:   10/16/22 1527  BP: 134/86  Pulse: 71    Physical examination   Pelvic:   Vulva: Normal appearance.  No lesions.  Vagina: No lesions or abnormalities noted.  Support: Normal pelvic support.  Urethra No masses tenderness or scarring.  Meatus Normal size without lesions or prolapse.  Cervix: Normal appearance.  No lesions.  Anus: Normal exam.  No lesions.  Perineum: Normal exam.  No lesions.        Bimanual   Uterus: Normal size.  Non-tender.  Mobile.  AV.  Adnexae: No masses.  Non-tender to palpation.  Cul-de-sac: Negative for abnormality.   IUD Procedure Pt has read the booklet and signed the appropriate forms regarding the Mirena IUD.  All of her questions have been answered.   The cervix was cleansed with betadine solution.  After sounding the uterus and noting the position, the IUD was placed in the usual manner without problem.  The string was cut to the appropriate length.  The patient tolerated the procedure well.            [facility] # = [id number]   Assessment:    G0P0000 Patient Active Problem List   Diagnosis Date Noted   Irregular periods 05/02/2022   Encounter for oral contraception initial prescription 05/02/2022   Encounter for screening mammogram for malignant neoplasm of breast 05/02/2022   Anxiety and depression 05/02/2022   Elevated blood pressure reading without diagnosis of hypertension 05/02/2022   Annual physical exam 05/02/2022   Positive pregnancy test 02/09/2022   Morbid obesity ([place]) 05/04/2016   Blood glucose elevated 12/28/2015   Migraine 12/28/2015   PCOS (polycystic ovarian syndrome) 12/28/2015   Avitaminosis D 12/28/2015     1. Encounter for IUD insertion       Plan:             F/U  Return in about 4 weeks (around 11/13/2022) for For IUD f/u.  Finis Bud, M.D. 10/16/2022 3:58 PM

## 2022-10-16 NOTE — Progress Notes (Signed)
Patient presents today for IUD insertion. She states no other questions or concerns.   

## 2022-10-17 ENCOUNTER — Encounter: Payer: Self-pay | Admitting: Obstetrics and Gynecology

## 2022-10-23 NOTE — Progress Notes (Unsigned)
I,Alexis Miles,acting as a scribe for Alexis Kindle, FNP.,have documented all relevant documentation on the behalf of Alexis Kindle, FNP,as directed by  Alexis Kindle, FNP while in the presence of Alexis Kindle, FNP. Established patient visit  Patient: Alexis Miles   DOB: 1976-11-21   46 y.o. Female  MRN: [id number] Visit Date: 10/25/2022  Today's healthcare provider: [name], FNP  Re Introduced to nurse practitioner role and practice setting.  All questions answered.  Discussed provider/patient relationship and expectations.  Subjective    HPI    Patient has had burning upon urination for around one week. Patient also has symptoms of frequency, urgency, abdominal pain, low back pain.   Medications: Outpatient Medications Prior to Visit  Medication Sig   cabergoline (DOSTINEX) 0.5 MG tablet Take 0.5 tablets (0.25 mg total) by mouth 2 (two) times a week for 60 doses.   metFORMIN (GLUCOPHAGE) 500 MG tablet Take 1 tablet (500 mg total) by mouth daily with breakfast.   Vitamin D, Ergocalciferol, (DRISDOL) 1.25 MG (50000 UNIT) CAPS capsule Take 1 capsule (50,000 Units total) by mouth every 7 (seven) days.   [DISCONTINUED] sertraline (ZOLOFT) 25 MG tablet Titrate 25 mg daily for 1-2 weeks, based on tolerance Titrate to 50 mg, two tablets, for next 1-2 weeks, based on tolerance Increased to 75 mg, three tablets for 1-2 weeks following, based on tolerance. Previous dose was 100 mg; can increase to 100 mg, 4 tablets, if needed. Recommend office visit after 6-8 weeks. Tolerance based on symptoms as well as repeat PHQ2/9 and GAD7 screening   No facility-administered medications prior to visit.   URINARY SYMPTOMS  Dysuria: yes Urinary frequency: yes Urgency: yes Small volume voids: no Symptom severity:  continuous, worsening x 7-10 days Urinary incontinence: no Foul odor: no Hematuria: yes Abdominal pain: yes Back pain: yes Suprapubic pain/pressure: yes Flank pain: yes Fever:   subjective Vomiting: no Relief with cranberry juice: no Relief with pyridium: no Status: better/worse/stable Previous urinary tract infection: yes Recurrent urinary tract infection: no Treatments attempted: increasing fluids   Review of Systems    Objective    BP 129/61 (BP Location: Right Arm, Patient Position: Sitting, Cuff Size: Large)   Pulse 88   Temp 97.7 F (36.5 C) (Temporal)   Resp 16   Ht 5' 5.5" (1.664 m)   Wt (!) 350 lb (158.8 kg)   LMP 10/16/2022   SpO2 97%   BMI 57.36 kg/m   Physical Exam Vitals and nursing note reviewed.  Constitutional:      General: She is not in acute distress.    Appearance: Normal appearance. She is obese. She is not ill-appearing, toxic-appearing or diaphoretic.  HENT:     Head: Normocephalic and atraumatic.  Cardiovascular:     Rate and Rhythm: Normal rate and regular rhythm.     Pulses: Normal pulses.     Heart sounds: Normal heart sounds. No murmur heard.    No friction rub. No gallop.  Pulmonary:     Effort: Pulmonary effort is normal. No respiratory distress.     Breath sounds: Normal breath sounds. No stridor. No wheezing, rhonchi or rales.  Chest:     Chest wall: No tenderness.  Abdominal:     General: Bowel sounds are normal.     Palpations: Abdomen is soft.     Tenderness: There is abdominal tenderness. There is no right CVA tenderness or left CVA tenderness.  Musculoskeletal:  General: No swelling, tenderness, deformity or signs of injury. Normal range of motion.     Right lower leg: No edema.     Left lower leg: No edema.  Skin:    General: Skin is warm and dry.     Capillary Refill: Capillary refill takes less than 2 seconds.     Coloration: Skin is not jaundiced or pale.     Findings: No bruising, erythema, lesion or rash.  Neurological:     General: No focal deficit present.     Mental Status: She is alert and oriented to person, place, and time. Mental status is at baseline.     Cranial Nerves: No  cranial nerve deficit.     Sensory: No sensory deficit.     Motor: No weakness.     Coordination: Coordination normal.  Psychiatric:        Mood and Affect: Mood normal.        Behavior: Behavior normal.        Thought Content: Thought content normal.        Judgment: Judgment normal.     Results for orders placed or performed in visit on 10/25/22  POCT urinalysis dipstick  Result Value Ref Range   Color, UA Dark Yellow    Clarity, UA Cloudy    Glucose, UA Negative Negative   Bilirubin, UA Negative    Ketones, UA Negative    Spec Grav, UA 1.020 1.010 - 1.025   Blood, UA Positive    pH, UA 6.5 5.0 - 8.0   Protein, UA Positive (A) Negative   Urobilinogen, UA 0.2 0.2 or 1.0 E.U./dL   Nitrite, UA Negative    Leukocytes, UA Large (3+) (A) Negative    Assessment & Plan     Problem List Items Addressed This Visit       Genitourinary   Acute cystitis with hematuria - Primary    Acute, worsening Has not improved with PO fluids Reports previous intolerance to AZO or similar; did not try Complaints of abdominal/back pains and frequency and urinary character changes UA+  Will start ABX Will send for Urine Cx Plan to follow up if ABX needs to be adjusted to fit bacterium       Relevant Medications   nitrofurantoin, macrocrystal-monohydrate, (MACROBID) 100 MG capsule   Other Relevant Orders   Urine Culture   POCT urinalysis dipstick (Completed)     Other   Anxiety and depression    Chronic, improving Previously using 75 mg- wishes to increase to 100 mg Will follow back up at CPE; encourage patient to reach out if symptoms change and/or exacerbate       Relevant Medications   sertraline (ZOLOFT) 100 MG tablet   Return in about 6 months (around 04/25/2023) for annual examination.     [name], FNP, have reviewed all documentation for this visit. The documentation on 10/25/22 for the exam, diagnosis, procedures, and orders are all accurate and complete.  Alexis Kindle, FNP  [facility] [phone] (phone) [phone] (fax)  [facility]

## 2022-10-25 ENCOUNTER — Ambulatory Visit: Payer: BC Managed Care – PPO | Admitting: Family Medicine

## 2022-10-25 ENCOUNTER — Encounter: Payer: Self-pay | Admitting: Family Medicine

## 2022-10-25 VITALS — BP 129/61 | HR 88 | Temp 97.7°F | Resp 16 | Ht 65.5 in | Wt 350.0 lb

## 2022-10-25 DIAGNOSIS — F32A Depression, unspecified: Secondary | ICD-10-CM | POA: Diagnosis not present

## 2022-10-25 DIAGNOSIS — N3001 Acute cystitis with hematuria: Secondary | ICD-10-CM | POA: Insufficient documentation

## 2022-10-25 DIAGNOSIS — F419 Anxiety disorder, unspecified: Secondary | ICD-10-CM | POA: Diagnosis not present

## 2022-10-25 LAB — POCT URINALYSIS DIPSTICK
Bilirubin, UA: NEGATIVE
Blood, UA: POSITIVE
Glucose, UA: NEGATIVE
Ketones, UA: NEGATIVE
Nitrite, UA: NEGATIVE
Protein, UA: POSITIVE — AB
Spec Grav, UA: 1.02 (ref 1.010–1.025)
Urobilinogen, UA: 0.2 E.U./dL
pH, UA: 6.5 (ref 5.0–8.0)

## 2022-10-25 MED ORDER — SERTRALINE HCL 100 MG PO TABS: 100.0000 mg | ORAL_TABLET | Freq: Every day | ORAL | 3 refills | Status: DC

## 2022-10-25 MED ORDER — NITROFURANTOIN MONOHYD MACRO 100 MG PO CAPS: 100.0000 mg | ORAL_CAPSULE | Freq: Two times a day (BID) | ORAL | 0 refills | Status: DC

## 2022-10-25 NOTE — Assessment & Plan Note (Signed)
Chronic, improving Previously using 75 mg- wishes to increase to 100 mg Will follow back up at CPE; encourage patient to reach out if symptoms change and/or exacerbate

## 2022-10-25 NOTE — Assessment & Plan Note (Signed)
Acute, worsening Has not improved with PO fluids Reports previous intolerance to AZO or similar; did not try Complaints of abdominal/back pains and frequency and urinary character changes UA+  Will start ABX Will send for Urine Cx Plan to follow up if ABX needs to be adjusted to fit bacterium

## 2022-10-30 LAB — URINE CULTURE

## 2022-10-30 NOTE — Progress Notes (Signed)
Abx was appropriate for Ecoli.  Thanks, Robynn Pane NP

## 2022-11-13 ENCOUNTER — Encounter: Payer: Self-pay | Admitting: Obstetrics and Gynecology

## 2022-11-13 ENCOUNTER — Ambulatory Visit: Payer: BC Managed Care – PPO | Admitting: Obstetrics and Gynecology

## 2022-11-13 VITALS — BP 142/80 | HR 80 | Ht 65.5 in | Wt 353.0 lb

## 2022-11-13 DIAGNOSIS — Z30431 Encounter for routine checking of intrauterine contraceptive device: Secondary | ICD-10-CM | POA: Diagnosis not present

## 2022-11-13 NOTE — Progress Notes (Signed)
HPI:      [name] is a 46 y.o. G0P0000 who LMP was Patient's last menstrual period was 10/16/2022.  Subjective:   She presents today for IUD follow-up.  She states that she had some spotting and now she is having her first menstrual period but otherwise she is doing well.  Has no cramping or pain.  She has not had intercourse with it yet.  She has not checked her strings.    Hx: The following portions of the patient's history were reviewed and updated as appropriate:             She  has a past medical history of Anxiety and PCOS (polycystic ovarian syndrome). She does not have any pertinent problems on file. She  has a past surgical history that includes Mouth surgery and Bartholin gland cyst excision. Her family history includes Alcohol abuse in her paternal grandfather; Anxiety disorder in her sister and sister; Arthritis in her mother; Breast cancer (age of onset: 108) in her maternal grandmother; Cancer in her maternal grandmother; Depression in her sister and sister; Diabetes in her father, maternal aunt, and maternal grandmother; Heart disease in her father; Hyperlipidemia in her sister; Hypertension in her sister; Lymphoma in her maternal aunt and mother. She  reports that she has never smoked. She has never used smokeless tobacco. She reports that she does not currently use alcohol. She reports that she does not use drugs. She has a current medication list which includes the following prescription(s): cabergoline, metformin, sertraline, vitamin d (ergocalciferol), and nitrofurantoin (macrocrystal-monohydrate). She is allergic to penicillins and trazodone and nefazodone.       Review of Systems:  Review of Systems  Constitutional: Denied constitutional symptoms, night sweats, recent illness, fatigue, fever, insomnia and weight loss.  Eyes: Denied eye symptoms, eye pain, photophobia, vision change and visual disturbance.  Ears/Nose/Throat/Neck: Denied ear, nose, throat or neck  symptoms, hearing loss, nasal discharge, sinus congestion and sore throat.  Cardiovascular: Denied cardiovascular symptoms, arrhythmia, chest pain/pressure, edema, exercise intolerance, orthopnea and palpitations.  Respiratory: Denied pulmonary symptoms, asthma, pleuritic pain, productive sputum, cough, dyspnea and wheezing.  Gastrointestinal: Denied, gastro-esophageal reflux, melena, nausea and vomiting.  Genitourinary: Denied genitourinary symptoms including symptomatic vaginal discharge, pelvic relaxation issues, and urinary complaints.  Musculoskeletal: Denied musculoskeletal symptoms, stiffness, swelling, muscle weakness and myalgia.  Dermatologic: Denied dermatology symptoms, rash and scar.  Neurologic: Denied neurology symptoms, dizziness, headache, neck pain and syncope.  Psychiatric: Denied psychiatric symptoms, anxiety and depression.  Endocrine: Denied endocrine symptoms including hot flashes and night sweats.   Meds:   Current Outpatient Medications on File Prior to Visit  Medication Sig Dispense Refill   cabergoline (DOSTINEX) 0.5 MG tablet Take 0.5 tablets (0.25 mg total) by mouth 2 (two) times a week for 60 doses. 10 tablet 2   metFORMIN (GLUCOPHAGE) 500 MG tablet Take 1 tablet (500 mg total) by mouth daily with breakfast. 90 tablet 3   sertraline (ZOLOFT) 100 MG tablet Take 1 tablet (100 mg total) by mouth daily. 90 tablet 3   Vitamin D, Ergocalciferol, (DRISDOL) 1.25 MG (50000 UNIT) CAPS capsule Take 1 capsule (50,000 Units total) by mouth every 7 (seven) days. 13 capsule 3   nitrofurantoin, macrocrystal-monohydrate, (MACROBID) 100 MG capsule Take 1 capsule (100 mg total) by mouth 2 (two) times daily. 14 capsule 0   No current facility-administered medications on file prior to visit.      Objective:     Vitals:   11/13/22 1459  BP: (!) 142/80  Pulse: 80   Filed Weights   11/13/22 1459  Weight: (!) 353 lb (160.1 kg)              Physical examination   Pelvic:    Vulva: Normal appearance.  No lesions.  Vagina: No lesions or abnormalities noted.  Support: Normal pelvic support.  Urethra No masses tenderness or scarring.  Meatus Normal size without lesions or prolapse.  Cervix: Normal appearance.  No lesions. IUD strings noted at cervical os.  Anus: Normal exam.  No lesions.  Perineum: Normal exam.  No lesions.        Bimanual   Uterus: Normal size.  Non-tender.  Mobile.  AV.  Adnexae: No masses.  Non-tender to palpation.  Cul-de-sac: Negative for abnormality.             Assessment:    G0P0000 Patient Active Problem List   Diagnosis Date Noted   Acute cystitis with hematuria 10/25/2022   Anxiety and depression 05/02/2022     1. Encounter for routine checking of intrauterine contraceptive device (IUD)        Plan:            1.  Follow-up for annual examination. Orders No orders of the defined types were placed in this encounter.   No orders of the defined types were placed in this encounter.     F/U  No follow-ups on file. I spent 18 minutes involved in the care of this patient preparing to see the patient by obtaining and reviewing her medical history (including labs, imaging tests and prior procedures), documenting clinical information in the electronic health record (EHR), counseling and coordinating care plans, writing and sending prescriptions, ordering tests or procedures and in direct communicating with the patient and medical staff discussing pertinent items from her history and physical exam.  Elonda Husky, M.D. 11/13/2022 3:24 PM

## 2022-11-13 NOTE — Progress Notes (Signed)
Patient presents today for IUD string check. She states she is only having light bleeding . Patient reports no pain, discomfort or trouble with intercourse. She has not had intercourse since getting the IUD. She has not tried to feel for the strings yet. No other questions or concerns.

## 2022-11-28 ENCOUNTER — Encounter: Payer: Self-pay | Admitting: Obstetrics and Gynecology

## 2023-05-10 ENCOUNTER — Encounter: Payer: BC Managed Care – PPO | Admitting: Family Medicine

## 2023-05-13 ENCOUNTER — Other Ambulatory Visit: Payer: Self-pay | Admitting: Family Medicine

## 2023-05-13 DIAGNOSIS — Z1231 Encounter for screening mammogram for malignant neoplasm of breast: Secondary | ICD-10-CM

## 2023-05-24 ENCOUNTER — Ambulatory Visit (INDEPENDENT_AMBULATORY_CARE_PROVIDER_SITE_OTHER): Payer: BC Managed Care – PPO | Admitting: Family Medicine

## 2023-05-24 ENCOUNTER — Encounter: Payer: Self-pay | Admitting: Family Medicine

## 2023-05-24 VITALS — BP 147/93 | HR 93 | Ht 65.5 in | Wt 367.0 lb

## 2023-05-24 DIAGNOSIS — Z1211 Encounter for screening for malignant neoplasm of colon: Secondary | ICD-10-CM | POA: Insufficient documentation

## 2023-05-24 DIAGNOSIS — R7303 Prediabetes: Secondary | ICD-10-CM | POA: Insufficient documentation

## 2023-05-24 DIAGNOSIS — Z0000 Encounter for general adult medical examination without abnormal findings: Secondary | ICD-10-CM | POA: Diagnosis not present

## 2023-05-24 DIAGNOSIS — E782 Mixed hyperlipidemia: Secondary | ICD-10-CM | POA: Diagnosis not present

## 2023-05-24 DIAGNOSIS — I10 Essential (primary) hypertension: Secondary | ICD-10-CM | POA: Insufficient documentation

## 2023-05-24 DIAGNOSIS — D229 Melanocytic nevi, unspecified: Secondary | ICD-10-CM | POA: Insufficient documentation

## 2023-05-24 DIAGNOSIS — E282 Polycystic ovarian syndrome: Secondary | ICD-10-CM | POA: Insufficient documentation

## 2023-05-24 DIAGNOSIS — E559 Vitamin D deficiency, unspecified: Secondary | ICD-10-CM | POA: Insufficient documentation

## 2023-05-24 MED ORDER — LOSARTAN POTASSIUM-HCTZ 100-25 MG PO TABS: 1.0000 | ORAL_TABLET | Freq: Every day | ORAL | 0 refills | Status: DC

## 2023-05-24 NOTE — Assessment & Plan Note (Signed)
Chronic, previously on supplementation Repeat Vit D labs 

## 2023-05-24 NOTE — Assessment & Plan Note (Signed)
Acute on chronic, worsening Referral to derm Has not been since in >20 years

## 2023-05-24 NOTE — Assessment & Plan Note (Signed)
Agreeable to refer for colon cancer screening

## 2023-05-24 NOTE — Assessment & Plan Note (Signed)
Chronic, worsening Start anti-hypertensive Pt endorses fatigue

## 2023-05-24 NOTE — Progress Notes (Signed)
Complete physical exam  Patient: Alexis Miles   DOB: Nov 08, 1976   47 y.o. Female  MRN: [id number] Visit Date: 05/24/2023  Today's healthcare provider: [name], FNP  Re Introduced to nurse practitioner role and practice setting.  All questions answered.  Discussed provider/patient relationship and expectations.  Chief Complaint  Patient presents with   Annual Exam   Subjective    Alexis Miles is a 47 y.o. female who presents today for a complete physical exam.  She reports consuming a general diet. The patient does not participate in regular exercise at present. She generally feels well. She reports sleeping well. She does have additional problems to discuss today.   HPI   Past Medical History:  Diagnosis Date   Anxiety    PCOS (polycystic ovarian syndrome)    Past Surgical History:  Procedure Laterality Date   BARTHOLIN GLAND CYST EXCISION     MOUTH SURGERY     wisdom teeth removed   Social History   Socioeconomic History   Marital status: Single    Spouse name: Not on file   Number of children: 0   Years of education: College   Highest education level: Bachelor's degree (e.g., BA, AB, BS)  Occupational History   Occupation: Full Time    Employer: [employer]    Comment: [employer]  Tobacco Use   Smoking status: Never   Smokeless tobacco: Never  Vaping Use   Vaping Use: Never used  Substance and Sexual Activity   Alcohol use: Yes    Comment: occ   Drug use: No   Sexual activity: Yes    Birth control/protection: None  Other Topics Concern   Not on file  Social History Narrative   Not on file   Social Determinants of Health   Financial Resource Strain: Low Risk  (08/13/2018)   Overall Financial Resource Strain (CARDIA)    Difficulty of Paying Living Expenses: Not hard at all  Food Insecurity: No Food Insecurity (08/13/2018)   Hunger Vital Sign    Worried About Running Out of Food in the Last Year: Never true    Ran Out of Food  in the Last Year: Never true  Transportation Needs: Unmet Transportation Needs (08/13/2018)   PRAPARE - Transportation    Lack of Transportation (Medical): Yes    Lack of Transportation (Non-Medical): Yes  Physical Activity: Inactive (08/13/2018)   Exercise Vital Sign    Days of Exercise per Week: 0 days    Minutes of Exercise per Session: 0 min  Stress: Stress Concern Present (05/24/2023)   [employer] of Occupational Health - Occupational Stress Questionnaire    Feeling of Stress : To some extent  Social Connections: Somewhat Isolated (08/13/2018)   Social Connection and Isolation Panel [NHANES]    Frequency of Communication with Friends and Family: More than three times a week    Frequency of Social Gatherings with Friends and Family: More than three times a week    Attends Religious Services: More than 4 times per year    Active Member of [employer] or Organizations: No    Attends Banker Meetings: Never    Marital Status: Never married  Intimate Partner Violence: Not At Risk (08/13/2018)   Humiliation, Afraid, Rape, and Kick questionnaire    Fear of Current or Ex-Partner: No    Emotionally Abused: No    Physically Abused: No    Sexually Abused: No   Family Status  Relation Name  Status   Mother  Deceased   Father  Alive   Sister  Alive   MGM  Deceased   PGF  Deceased       MI   Sister  Alive   Mat Aunt  (Not Specified)   Family History  Problem Relation Age of Onset   Arthritis Mother    Lymphoma Mother    Heart disease Father        MI, CABG, CAD   Diabetes Father    Hyperlipidemia Sister    Depression Sister    Anxiety disorder Sister    Cancer Maternal Grandmother        colon, breast    Diabetes Maternal Grandmother    Breast cancer Maternal Grandmother 22   Alcohol abuse Paternal Grandfather    Hypertension Sister    Depression Sister    Anxiety disorder Sister    Diabetes Maternal Aunt    Lymphoma Maternal Aunt    Allergies  Allergen  Reactions   Penicillins Other (See Comments)    Has patient had a PCN reaction causing immediate rash, facial/tongue/throat swelling, SOB or lightheadedness with hypotension: Unknown Has patient had a PCN reaction causing severe rash involving mucus membranes or skin necrosis: Unknown Has patient had a PCN reaction that required hospitalization: Unknown Has patient had a PCN reaction occurring within the last 10 years: Unknown If all of the above answers are "NO", then may proceed with Cephalosporin use.    Trazodone And Nefazodone Other (See Comments)    Suicidal Ideation    Patient Care Team: [name], FNP as PCP - General (Family Medicine) Malva Limes, MD as Referring Physician (Family Medicine)   Medications: Outpatient Medications Prior to Visit  Medication Sig   metFORMIN (GLUCOPHAGE) 500 MG tablet Take 1 tablet (500 mg total) by mouth daily with breakfast.   sertraline (ZOLOFT) 100 MG tablet Take 1 tablet (100 mg total) by mouth daily.   Vitamin D, Ergocalciferol, (DRISDOL) 1.25 MG (50000 UNIT) CAPS capsule Take 1 capsule (50,000 Units total) by mouth every 7 (seven) days.   [DISCONTINUED] nitrofurantoin, macrocrystal-monohydrate, (MACROBID) 100 MG capsule Take 1 capsule (100 mg total) by mouth 2 (two) times daily.   No facility-administered medications prior to visit.   Review of Systems  Last CBC Lab Results  Component Value Date   WBC 7.3 07/11/2021   HGB 13.4 07/11/2021   HCT 41.7 07/11/2021   MCV 82 07/11/2021   MCH 26.3 (L) 07/11/2021   RDW 14.3 07/11/2021   PLT 290 07/11/2021   Last metabolic panel Lab Results  Component Value Date   GLUCOSE 93 05/02/2022   NA 140 05/02/2022   K 4.3 05/02/2022   CL 96 05/02/2022   CO2 24 05/02/2022   BUN 14 05/02/2022   CREATININE 0.93 05/02/2022   EGFR 78 05/02/2022   CALCIUM 9.4 05/02/2022   PROT 7.1 05/02/2022   ALBUMIN 4.1 05/02/2022   LABGLOB 3.0 05/02/2022   AGRATIO 1.4 05/02/2022   BILITOT 0.5  05/02/2022   ALKPHOS 72 05/02/2022   AST 22 05/02/2022   ALT 27 05/02/2022   ANIONGAP 8 07/22/2018   Last lipids Lab Results  Component Value Date   CHOL 198 05/02/2022   HDL 50 05/02/2022   LDLCALC 126 (H) 05/02/2022   TRIG 122 05/02/2022   CHOLHDL 4.4 06/24/2020   Last hemoglobin A1c Lab Results  Component Value Date   HGBA1C 5.7 (H) 05/02/2022   Last thyroid functions Lab Results  Component Value Date   TSH 2.010 05/02/2022   Last vitamin D Lab Results  Component Value Date   VD25OH 20.4 (L) 05/02/2022    Objective    BP (!) 147/93 (BP Location: Left Arm, Patient Position: Sitting, Cuff Size: Large)   Pulse 93   Ht 5' 5.5" (1.664 m)   Wt (!) 367 lb (166.5 kg)   SpO2 98%   BMI 60.14 kg/m   BP Readings from Last 3 Encounters:  05/24/23 (!) 147/93  11/13/22 (!) 142/80  10/25/22 129/61   Wt Readings from Last 3 Encounters:  05/24/23 (!) 367 lb (166.5 kg)  11/13/22 (!) 353 lb (160.1 kg)  10/25/22 (!) 350 lb (158.8 kg)   SpO2 Readings from Last 3 Encounters:  05/24/23 98%  10/25/22 97%  05/04/22 98%   Physical Exam Vitals and nursing note reviewed.  Constitutional:      General: She is awake. She is not in acute distress.    Appearance: Normal appearance. She is well-developed and well-groomed. She is obese. She is not ill-appearing, toxic-appearing or diaphoretic.  HENT:     Head: Normocephalic and atraumatic.     Jaw: There is normal jaw occlusion. No trismus, tenderness, swelling or pain on movement.     Right Ear: Hearing, tympanic membrane, ear canal and external ear normal. There is no impacted cerumen.     Left Ear: Hearing, tympanic membrane, ear canal and external ear normal. There is no impacted cerumen.     Nose: Nose normal. No congestion or rhinorrhea.     Right Turbinates: Not enlarged, swollen or pale.     Left Turbinates: Not enlarged, swollen or pale.     Right Sinus: No maxillary sinus tenderness or frontal sinus tenderness.     Left  Sinus: No maxillary sinus tenderness or frontal sinus tenderness.     Mouth/Throat:     Lips: Pink.     Mouth: Mucous membranes are moist. No injury.     Tongue: No lesions.     Pharynx: Oropharynx is clear. Uvula midline. No pharyngeal swelling, oropharyngeal exudate, posterior oropharyngeal erythema or uvula swelling.     Tonsils: No tonsillar exudate or tonsillar abscesses.  Eyes:     General: Lids are normal. Lids are everted, no foreign bodies appreciated. Vision grossly intact. Gaze aligned appropriately. No allergic shiner or visual field deficit.       Right eye: No discharge.        Left eye: No discharge.     Extraocular Movements: Extraocular movements intact.     Conjunctiva/sclera: Conjunctivae normal.     Right eye: Right conjunctiva is not injected. No exudate.    Left eye: Left conjunctiva is not injected. No exudate.    Pupils: Pupils are equal, round, and reactive to light.  Neck:     Thyroid: No thyroid mass, thyromegaly or thyroid tenderness.     Vascular: No carotid bruit.     Trachea: Trachea normal.  Cardiovascular:     Rate and Rhythm: Normal rate and regular rhythm.     Pulses: Normal pulses.          Carotid pulses are 2+ on the right side and 2+ on the left side.      Radial pulses are 2+ on the right side and 2+ on the left side.       Dorsalis pedis pulses are 2+ on the right side and 2+ on the left side.       Posterior  tibial pulses are 2+ on the right side and 2+ on the left side.     Heart sounds: Normal heart sounds, S1 normal and S2 normal. No murmur heard.    No friction rub. No gallop.  Pulmonary:     Effort: Pulmonary effort is normal. No respiratory distress.     Breath sounds: Normal breath sounds and air entry. No stridor. No wheezing, rhonchi or rales.  Chest:     Chest wall: No tenderness.  Abdominal:     General: Abdomen is flat. Bowel sounds are normal. There is no distension.     Palpations: Abdomen is soft. There is no mass.      Tenderness: There is no abdominal tenderness. There is no right CVA tenderness, left CVA tenderness, guarding or rebound.     Hernia: No hernia is present.  Genitourinary:    Comments: Exam deferred; denies complaints Musculoskeletal:        General: No swelling, tenderness, deformity or signs of injury. Normal range of motion.     Cervical back: Full passive range of motion without pain, normal range of motion and neck supple. No edema, rigidity or tenderness. No muscular tenderness.     Right lower leg: No edema.     Left lower leg: No edema.  Lymphadenopathy:     Cervical: No cervical adenopathy.     Right cervical: No superficial, deep or posterior cervical adenopathy.    Left cervical: No superficial, deep or posterior cervical adenopathy.  Skin:    General: Skin is warm and dry.     Capillary Refill: Capillary refill takes less than 2 seconds.     Coloration: Skin is not jaundiced or pale.     Findings: No bruising, erythema, lesion or rash.  Neurological:     General: No focal deficit present.     Mental Status: She is alert and oriented to person, place, and time. Mental status is at baseline.     GCS: GCS eye subscore is 4. GCS verbal subscore is 5. GCS motor subscore is 6.     Sensory: Sensation is intact. No sensory deficit.     Motor: Motor function is intact. No weakness.     Coordination: Coordination is intact. Coordination normal.     Gait: Gait is intact. Gait normal.  Psychiatric:        Attention and Perception: Attention and perception normal.        Mood and Affect: Mood and affect normal.        Speech: Speech normal.        Behavior: Behavior normal. Behavior is cooperative.        Thought Content: Thought content normal.        Cognition and Memory: Cognition and memory normal.        Judgment: Judgment normal.     Last depression screening scores    05/24/2023    1:31 PM 10/25/2022    8:04 AM 05/02/2022    9:58 AM  PHQ 2/9 Scores  PHQ - 2 Score 1 0 0   PHQ- 9 Score 7 3 2    Last fall risk screening    05/24/2023    1:31 PM  Fall Risk   Falls in the past year? 0  Number falls in past yr: 0  Injury with Fall? 0   Last Audit-C alcohol use screening    05/24/2023    1:31 PM  Alcohol Use Disorder Test (AUDIT)  1. How often do you  have a drink containing alcohol? 1  2. How many drinks containing alcohol do you have on a typical day when you are drinking? 0  3. How often do you have six or more drinks on one occasion? 0  AUDIT-C Score 1   A score of 3 or more in women, and 4 or more in men indicates increased risk for alcohol abuse, EXCEPT if all of the points are from question 1   No results found for any visits on 05/24/23.  Assessment & Plan    Routine Health Maintenance and Physical Exam  Exercise Activities and Dietary recommendations  Goals   None     Immunization History  Administered Date(s) Administered   Influenza Split 10/01/2013   Influenza,inj,Quad PF,6+ Mos 12/28/2015, 09/05/2018, 08/30/2021   Influenza-Unspecified 10/17/2016, 10/03/2017, 09/28/2020   Moderna Sars-Covid-2 Vaccination 02/06/2020, 03/05/2020   Tdap 06/27/2018    Health Maintenance  Topic Date Due   COVID-19 Vaccine (3 - Moderna risk series) 04/02/2020   PAP SMEAR-Modifier  06/25/2023   INFLUENZA VACCINE  07/11/2023   DTaP/Tdap/Td (2 - Td or Tdap) 06/27/2028   Hepatitis C Screening  Completed   HPV VACCINES  Aged Out   HIV Screening  Discontinued    Discussed health benefits of physical activity, and encouraged her to engage in regular exercise appropriate for her age and condition.  Problem List Items Addressed This Visit       Cardiovascular and Mediastinum   Primary hypertension    Chronic, worsening Start anti-hypertensive Pt endorses fatigue      Relevant Medications   losartan-hydrochlorothiazide (HYZAAR) 100-25 MG tablet     Endocrine   PCOS (polycystic ovarian syndrome)    Chronic, stable Weight gain noted; pt  without complaints Continue to monitor prolactin and A1c      Relevant Orders   Prolactin     Musculoskeletal and Integument   Atypical nevi    Acute on chronic, worsening Referral to derm Has not been since in >20 years       Relevant Orders   Ambulatory referral to Dermatology     Other   Annual physical exam - Primary    UTD on dental Plans for mammogram Agreeable for colon cancer screening Things to do to keep yourself healthy  - Exercise at least 30-45 minutes a day, 3-4 days a week.  - Eat a low-fat diet with lots of fruits and vegetables, up to 7-9 servings per day.  - Seatbelts can save your life. Wear them always.  - Smoke detectors on every level of your home, check batteries every year.  - Eye Doctor - have an eye exam every 1-2 years  - Safe sex - if you may be exposed to STDs, use a condom.  - Alcohol -  If you drink, do it moderately, less than 2 drinks per day.  - Health Care Power of Attorney. Choose someone to speak for you if you are not able.  - Depression is common in our stressful world.If you're feeling down or losing interest in things you normally enjoy, please come in for a visit.  - Violence - If anyone is threatening or hurting you, please call immediately.       Relevant Orders   CBC with Differential/Platelet   Comprehensive metabolic panel   TSH   Avitaminosis D    Chronic, previously on supplementation Repeat Vit D labs      Relevant Orders   Vitamin D (25 hydroxy)   Moderate  mixed hyperlipidemia not requiring statin therapy    Chronic, stable The 10-year ASCVD risk score (Arnett DK, et al., 2019) is: 1.5% recommend diet low in saturated fat and regular exercise - 30 min at least 5 times per week       Relevant Medications   losartan-hydrochlorothiazide (HYZAAR) 100-25 MG tablet   Other Relevant Orders   Lipid panel   Morbid obesity (HCC)    Chronic, weight gain noted Body mass index is 60.14 kg/m. Discussed importance of  healthy weight management Discussed diet and exercise       Prediabetes    Chronic, repeat A1c Continue to recommend balanced, lower carb meals. Smaller meal size, adding snacks. Choosing water as drink of choice and increasing purposeful exercise.       Relevant Orders   Hemoglobin A1c   Screen for colon cancer    Agreeable to refer for colon cancer screening       Relevant Orders   Ambulatory referral to Gastroenterology   Return in about 6 weeks (around 07/05/2023) for blood pressure check, nurse follow up, HTN management.    [name], FNP, have reviewed all documentation for this visit. The documentation on 05/24/23 for the exam, diagnosis, procedures, and orders are all accurate and complete.  [name], FNP  [facility] [phone] (phone) [phone] (fax)  [facility]

## 2023-05-24 NOTE — Assessment & Plan Note (Signed)
Chronic, repeat A1c Continue to recommend balanced, lower carb meals. Smaller meal size, adding snacks. Choosing water as drink of choice and increasing purposeful exercise.  

## 2023-05-24 NOTE — Assessment & Plan Note (Signed)
Chronic, stable Weight gain noted; pt without complaints Continue to monitor prolactin and A1c

## 2023-05-24 NOTE — Assessment & Plan Note (Signed)
Chronic, weight gain noted Body mass index is 60.14 kg/m. Discussed importance of healthy weight management Discussed diet and exercise

## 2023-05-24 NOTE — Assessment & Plan Note (Signed)
Chronic, stable The 10-year ASCVD risk score (Arnett DK, et al., 2019) is: 1.5% recommend diet low in saturated fat and regular exercise - 30 min at least 5 times per week

## 2023-05-24 NOTE — Assessment & Plan Note (Signed)
UTD on dental Plans for mammogram Agreeable for colon cancer screening Things to do to keep yourself healthy  - Exercise at least 30-45 minutes a day, 3-4 days a week.  - Eat a low-fat diet with lots of fruits and vegetables, up to 7-9 servings per day.  - Seatbelts can save your life. Wear them always.  - Smoke detectors on every level of your home, check batteries every year.  - Eye Doctor - have an eye exam every 1-2 years  - Safe sex - if you may be exposed to STDs, use a condom.  - Alcohol -  If you drink, do it moderately, less than 2 drinks per day.  - Health Care Power of Attorney. Choose someone to speak for you if you are not able.  - Depression is common in our stressful world.If you're feeling down or losing interest in things you normally enjoy, please come in for a visit.  - Violence - If anyone is threatening or hurting you, please call immediately.

## 2023-05-25 LAB — LIPID PANEL
Chol/HDL Ratio: 4.3 ratio (ref 0.0–4.4)
Cholesterol, Total: 190 mg/dL (ref 100–199)
HDL: 44 mg/dL (ref >39)
LDL Chol Calc (NIH): 123 mg/dL — ABNORMAL HIGH (ref 0–99)
Triglycerides: 130 mg/dL (ref 0–149)
VLDL Cholesterol Cal: 23 mg/dL (ref 5–40)

## 2023-05-25 LAB — COMPREHENSIVE METABOLIC PANEL
ALT: 21 IU/L (ref 0–32)
AST: 19 IU/L (ref 0–40)
Albumin: 3.6 g/dL — ABNORMAL LOW (ref 3.9–4.9)
Alkaline Phosphatase: 86 IU/L (ref 44–121)
BUN/Creatinine Ratio: 12 (ref 9–23)
BUN: 12 mg/dL (ref 6–24)
Bilirubin Total: 0.3 mg/dL (ref 0.0–1.2)
CO2: 24 mmol/L (ref 20–29)
Calcium: 9.2 mg/dL (ref 8.7–10.2)
Chloride: 101 mmol/L (ref 96–106)
Creatinine, Ser: 1.01 mg/dL — ABNORMAL HIGH (ref 0.57–1.00)
Globulin, Total: 2.8 g/dL (ref 1.5–4.5)
Glucose: 166 mg/dL — ABNORMAL HIGH (ref 70–99)
Potassium: 4.4 mmol/L (ref 3.5–5.2)
Sodium: 138 mmol/L (ref 134–144)
Total Protein: 6.4 g/dL (ref 6.0–8.5)
eGFR: 70 mL/min/{1.73_m2} (ref >59)

## 2023-05-25 LAB — CBC WITH DIFFERENTIAL/PLATELET
Basophils Absolute: 0 10*3/uL (ref 0.0–0.2)
Basos: 0 %
EOS (ABSOLUTE): 0.1 10*3/uL (ref 0.0–0.4)
Eos: 1 %
Hematocrit: 38.3 % (ref 34.0–46.6)
Hemoglobin: 12.6 g/dL (ref 11.1–15.9)
Immature Grans (Abs): 0 10*3/uL (ref 0.0–0.1)
Immature Granulocytes: 0 %
Lymphocytes Absolute: 1.9 10*3/uL (ref 0.7–3.1)
Lymphs: 24 %
MCH: 26.8 pg (ref 26.6–33.0)
MCHC: 32.9 g/dL (ref 31.5–35.7)
MCV: 82 fL (ref 79–97)
Monocytes Absolute: 0.5 10*3/uL (ref 0.1–0.9)
Monocytes: 6 %
Neutrophils Absolute: 5.3 10*3/uL (ref 1.4–7.0)
Neutrophils: 69 %
Platelets: 285 10*3/uL (ref 150–450)
RBC: 4.7 x10E6/uL (ref 3.77–5.28)
RDW: 13.3 % (ref 11.7–15.4)
WBC: 7.8 10*3/uL (ref 3.4–10.8)

## 2023-05-25 LAB — TSH: TSH: 1.79 u[IU]/mL (ref 0.450–4.500)

## 2023-05-25 LAB — HEMOGLOBIN A1C
Est. average glucose Bld gHb Est-mCnc: 134 mg/dL
Hgb A1c MFr Bld: 6.3 % — ABNORMAL HIGH (ref 4.8–5.6)

## 2023-05-25 LAB — VITAMIN D 25 HYDROXY (VIT D DEFICIENCY, FRACTURES): Vit D, 25-Hydroxy: 15.4 ng/mL — ABNORMAL LOW (ref 30.0–100.0)

## 2023-05-25 LAB — PROLACTIN: Prolactin: 27.6 ng/mL (ref 4.8–33.4)

## 2023-05-25 NOTE — Progress Notes (Signed)
The 10-year ASCVD risk score (Arnett DK, et al., 2019) is: 1.8% Borderline elevation in creatinine with returned low levels of protein in blood, albumin.  Cholesterol also remains borderline. I continue to recommend diet low in saturated fat and regular exercise - 30 min at least 5 times per week  A1c is increased; remains borderline with diabetes at 6.3%. Continue to recommend balanced, lower carb meals. Smaller meal size, adding snacks. Choosing water as drink of choice and increasing purposeful exercise. Continue taking metformin as previous prescribed.  Recommend restart of Vit D supplements- if easier, pick up 5000 IU Vit D 3 OTC to assist.

## 2023-05-31 ENCOUNTER — Ambulatory Visit
Admission: RE | Admit: 2023-05-31 | Discharge: 2023-05-31 | Disposition: A | Discharge status: Home / Self Care | Payer: BC Managed Care – PPO | Source: Ambulatory Visit | Attending: Family Medicine | Admitting: Family Medicine

## 2023-05-31 DIAGNOSIS — Z1231 Encounter for screening mammogram for malignant neoplasm of breast: Secondary | ICD-10-CM | POA: Insufficient documentation

## 2023-06-05 ENCOUNTER — Encounter: Payer: Self-pay | Admitting: *Deleted

## 2023-06-19 ENCOUNTER — Telehealth: Payer: Self-pay

## 2023-06-19 ENCOUNTER — Other Ambulatory Visit: Payer: Self-pay

## 2023-06-19 DIAGNOSIS — Z1211 Encounter for screening for malignant neoplasm of colon: Secondary | ICD-10-CM

## 2023-06-19 MED ORDER — NA SULFATE-K SULFATE-MG SULF 17.5-3.13-1.6 GM/177ML PO SOLN: 1.0000 | Freq: Once | ORAL | 0 refills | Status: AC

## 2023-06-19 NOTE — Telephone Encounter (Signed)
Patient is calling to schedule colonoscopy. She states she will be available all day with her phone beside her.

## 2023-06-19 NOTE — Telephone Encounter (Signed)
Gastroenterology Pre-Procedure Review  Request Date: 10/15/23 Requesting Physician: Dr. Allegra Lai  PATIENT REVIEW QUESTIONS: The patient responded to the following health history questions as indicated:    1. Are you having any GI issues? no 2. Do you have a personal history of Polyps? no 3. Do you have a family history of Colon Cancer or Polyps? yes (maternal grandmother colon cancer) 4. Diabetes Mellitus? No-prediabetic takes Metformin has been advised to stop 2 days prior to colonoscopy. 5. Joint replacements in the past 12 months?no 6. Major health problems in the past 3 months?no 7. Any artificial heart valves, MVP, or defibrillator?no    MEDICATIONS & ALLERGIES:    Patient reports the following regarding taking any anticoagulation/antiplatelet therapy:   Plavix, Coumadin, Eliquis, Xarelto, Lovenox, Pradaxa, Brilinta, or Effient? no Aspirin? no  Patient confirms/reports the following medications:  Current Outpatient Medications  Medication Sig Dispense Refill   losartan-hydrochlorothiazide (HYZAAR) 100-25 MG tablet Take 1 tablet by mouth daily. 90 tablet 0   metFORMIN (GLUCOPHAGE) 500 MG tablet Take 1 tablet (500 mg total) by mouth daily with breakfast. 90 tablet 3   sertraline (ZOLOFT) 100 MG tablet Take 1 tablet (100 mg total) by mouth daily. 90 tablet 3   Vitamin D, Ergocalciferol, (DRISDOL) 1.25 MG (50000 UNIT) CAPS capsule Take 1 capsule (50,000 Units total) by mouth every 7 (seven) days. 13 capsule 3   No current facility-administered medications for this visit.    Patient confirms/reports the following allergies:  Allergies  Allergen Reactions   Penicillins Other (See Comments)    Has patient had a PCN reaction causing immediate rash, facial/tongue/throat swelling, SOB or lightheadedness with hypotension: Unknown Has patient had a PCN reaction causing severe rash involving mucus membranes or skin necrosis: Unknown Has patient had a PCN reaction that required  hospitalization: Unknown Has patient had a PCN reaction occurring within the last 10 years: Unknown If all of the above answers are "NO", then may proceed with Cephalosporin use.    Trazodone And Nefazodone Other (See Comments)    Suicidal Ideation    No orders of the defined types were placed in this encounter.   AUTHORIZATION INFORMATION Primary Insurance: 1D#: Group #:  Secondary Insurance: 1D#: Group #:  SCHEDULE INFORMATION: Date: 10/15/23 Time: Location: [facility]

## 2023-07-11 ENCOUNTER — Ambulatory Visit (INDEPENDENT_AMBULATORY_CARE_PROVIDER_SITE_OTHER): Payer: BC Managed Care – PPO | Admitting: Obstetrics and Gynecology

## 2023-07-11 ENCOUNTER — Encounter: Payer: Self-pay | Admitting: Obstetrics and Gynecology

## 2023-07-11 VITALS — BP 108/76 | HR 83 | Ht 65.5 in | Wt 359.0 lb

## 2023-07-11 DIAGNOSIS — Z01419 Encounter for gynecological examination (general) (routine) without abnormal findings: Secondary | ICD-10-CM | POA: Diagnosis not present

## 2023-07-11 DIAGNOSIS — Z30431 Encounter for routine checking of intrauterine contraceptive device: Secondary | ICD-10-CM

## 2023-07-11 DIAGNOSIS — Z1211 Encounter for screening for malignant neoplasm of colon: Secondary | ICD-10-CM

## 2023-07-11 DIAGNOSIS — Z1231 Encounter for screening mammogram for malignant neoplasm of breast: Secondary | ICD-10-CM

## 2023-07-11 NOTE — Progress Notes (Addendum)
PCP: [name], FNP   Chief Complaint  Patient presents with   Gynecologic Exam    No concerns    HPI:      [name] is a 47 y.o. G0P0000 whose LMP was Patient's last menstrual period was 07/08/2023 (exact date)., presents today for her annual examination.  Her menses are regular every 28-30 days, lasting 1-3 days light spotting/with wiping, occas has BTB, occas dysmen. Hx of PCOS.   11/23 NOTE from Dr. Logan Bores: "She has irregular heavy bleeding. Endometrial biopsy was negative for malignancy but showed possibility of endometrial polyp. Rather than go to the operating room patient has decided upon IUD to see if it controls her bleeding. She is aware that this is not standard treatment for a polyp and it may not cause her bleeding to resolve. If she continues to experience heavy bleeding would then recommend D&C. She would like to try the IUD first. "  Mriena placed 10/16/22.  Sex activity: single partner, contraception - IUD. Mirena placed 10/16/22; She does not have vaginal dryness/dryness/pain.  Last Pap: 06/24/20 Results were: no abnormalities /neg HPV DNA. No hx of abn paps  Last mammogram: 05/31/23  Results were: normal--routine follow-up in 12 months There is a FH of breast cancer in her MGM, genetic testing not indicated. There is no FH of ovarian cancer. The patient does not do self-breast exams.  Colonoscopy: has appt 11/24 at [facility]   Tobacco use: The patient denies current or previous tobacco use. Alcohol use: social drinker No drug use Exercise: moderately active  She does get adequate calcium and Vitamin D in her diet.  Labs with PCP.   Patient Active Problem List   Diagnosis Date Noted   Atypical nevi 05/24/2023   Prediabetes 05/24/2023   Primary hypertension 05/24/2023   Moderate mixed hyperlipidemia not requiring statin therapy 05/24/2023   Screen for colon cancer 05/24/2023   Avitaminosis D 05/24/2023   PCOS (polycystic ovarian syndrome)  05/24/2023   Morbid obesity (HCC) 05/24/2023   Annual physical exam 05/02/2022    Past Surgical History:  Procedure Laterality Date   BARTHOLIN GLAND CYST EXCISION     MOUTH SURGERY     wisdom teeth removed    Family History  Problem Relation Age of Onset   Arthritis Mother    Lymphoma Mother    Heart disease Father        MI, CABG, CAD   Diabetes Father    Skin cancer Father        3 times   Hyperlipidemia Sister    Depression Sister    Anxiety disorder Sister    Hypertension Sister    Depression Sister    Anxiety disorder Sister    Diabetes Maternal Grandmother    Breast cancer Maternal Grandmother        81s   Colon cancer Maternal Grandmother    Skin cancer Paternal Grandmother    Colon cancer Paternal Grandmother    Alcohol abuse Paternal Grandfather    Diabetes Maternal Aunt    Lymphoma Maternal Aunt    Cervical cancer Maternal Aunt        unsure of age   Skin cancer Maternal Uncle        multiple times    Social History   Socioeconomic History   Marital status: Single    Spouse name: Not on file   Number of children: 0   Years of education: College   Highest education level:  Bachelor's degree (e.g., BA, AB, BS)  Occupational History   Occupation: Full Time    Employer: [employer]    Comment: [employer]  Tobacco Use   Smoking status: Never   Smokeless tobacco: Never  Vaping Use   Vaping status: Never Used  Substance and Sexual Activity   Alcohol use: Yes    Comment: occ   Drug use: No   Sexual activity: Yes    Birth control/protection: I.U.D.  Other Topics Concern   Not on file  Social History Narrative   Not on file   Social Determinants of Health   Financial Resource Strain: Low Risk  (08/13/2018)   Overall Financial Resource Strain (CARDIA)    Difficulty of Paying Living Expenses: Not hard at all  Food Insecurity: No Food Insecurity (08/13/2018)   Hunger Vital Sign    Worried About Running Out of Food in the Last  Year: Never true    Ran Out of Food in the Last Year: Never true  Transportation Needs: Unmet Transportation Needs (08/13/2018)   PRAPARE - Administrator, Civil Service (Medical): Yes    Lack of Transportation (Non-Medical): Yes  Physical Activity: Inactive (08/13/2018)   Exercise Vital Sign    Days of Exercise per Week: 0 days    Minutes of Exercise per Session: 0 min  Stress: Stress Concern Present (05/24/2023)   [employer] of Occupational Health - Occupational Stress Questionnaire    Feeling of Stress : To some extent  Social Connections: Somewhat Isolated (08/13/2018)   Social Connection and Isolation Panel [NHANES]    Frequency of Communication with Friends and Family: More than three times a week    Frequency of Social Gatherings with Friends and Family: More than three times a week    Attends Religious Services: More than 4 times per year    Active Member of [employer] or Organizations: No    Attends Banker Meetings: Never    Marital Status: Never married  Intimate Partner Violence: Not At Risk (08/13/2018)   Humiliation, Afraid, Rape, and Kick questionnaire    Fear of Current or Ex-Partner: No    Emotionally Abused: No    Physically Abused: No    Sexually Abused: No     Current Outpatient Medications:    levonorgestrel (MIRENA) 20 MCG/DAY IUD, 1 each by Intrauterine route once., Disp: , Rfl:    losartan-hydrochlorothiazide (HYZAAR) 100-25 MG tablet, Take 1 tablet by mouth daily., Disp: 90 tablet, Rfl: 0   metFORMIN (GLUCOPHAGE) 500 MG tablet, Take 1 tablet (500 mg total) by mouth daily with breakfast., Disp: 90 tablet, Rfl: 3   sertraline (ZOLOFT) 100 MG tablet, Take 1 tablet (100 mg total) by mouth daily., Disp: 90 tablet, Rfl: 3   Vitamin D, Ergocalciferol, (DRISDOL) 1.25 MG (50000 UNIT) CAPS capsule, Take 1 capsule (50,000 Units total) by mouth every 7 (seven) days., Disp: 13 capsule, Rfl: 3   Na Sulfate-K Sulfate-Mg Sulf 17.5-3.13-1.6 GM/177ML SOLN,  See admin instructions. (Patient not taking: Reported on 07/11/2023), Disp: , Rfl:      ROS:  Review of Systems  Constitutional:  Negative for fatigue, fever and unexpected weight change.  Respiratory:  Negative for cough, shortness of breath and wheezing.   Cardiovascular:  Negative for chest pain, palpitations and leg swelling.  Gastrointestinal:  Positive for constipation and nausea. Negative for blood in stool, diarrhea and vomiting.  Endocrine: Negative for cold intolerance, heat intolerance and polyuria.  Genitourinary:  Negative for  dyspareunia, dysuria, flank pain, frequency, genital sores, hematuria, menstrual problem, pelvic pain, urgency, vaginal bleeding, vaginal discharge and vaginal pain.  Musculoskeletal:  Negative for back pain, joint swelling and myalgias.  Skin:  Negative for rash.  Neurological:  Negative for dizziness, syncope, light-headedness, numbness and headaches.  Hematological:  Negative for adenopathy.  Psychiatric/Behavioral:  Negative for agitation, confusion, sleep disturbance and suicidal ideas. The patient is not nervous/anxious.    BREAST: No symptoms    Objective: BP 108/76   Pulse 83   Ht 5' 5.5" (1.664 m)   Wt (!) 359 lb (162.8 kg)   LMP 07/08/2023 (Exact Date)   BMI 58.83 kg/m    Physical Exam Constitutional:      Appearance: She is well-developed.  Genitourinary:     Vulva normal.     Right Labia: No rash, tenderness or lesions.    Left Labia: No tenderness, lesions or rash.    No vaginal discharge, erythema or tenderness.      Right Adnexa: not tender.    Left Adnexa: not tender.    No cervical friability or polyp.     IUD strings visualized.  Breasts:    Right: No mass, nipple discharge, skin change or tenderness.     Left: No mass, nipple discharge, skin change or tenderness.  Neck:     Thyroid: No thyromegaly.  Cardiovascular:     Rate and Rhythm: Normal rate and regular rhythm.     Heart sounds: Normal heart sounds. No  murmur heard. Pulmonary:     Effort: Pulmonary effort is normal.     Breath sounds: Normal breath sounds.  Abdominal:     Palpations: Abdomen is soft.     Tenderness: There is no abdominal tenderness. There is no guarding or rebound.  Musculoskeletal:        General: Normal range of motion.     Cervical back: Normal range of motion.  Lymphadenopathy:     Cervical: No cervical adenopathy.  Neurological:     General: No focal deficit present.     Mental Status: She is alert and oriented to person, place, and time.     Cranial Nerves: No cranial nerve deficit.  Skin:    General: Skin is warm and dry.  Psychiatric:        Mood and Affect: Mood normal.        Behavior: Behavior normal.        Thought Content: Thought content normal.        Judgment: Judgment normal.  Vitals reviewed.     Assessment/Plan:  Encounter for annual routine gynecological examination  Encounter for routine checking of intrauterine contraceptive device (IUD)--IUD strings in cx os, has 8 yr indication. Heavy bleeding improved. F/u prn.   Encounter for screening mammogram for malignant neoplasm of breast; pt current on mammo         GYN counsel breast self exam, mammography screening, adequate intake of calcium and vitamin D, diet and exercise    F/U  Return in about 1 year (around 07/10/2024).  Katerine Morua B. Deneene Tarver, PA-C 07/11/2023 4:22 PM

## 2023-07-11 NOTE — Patient Instructions (Addendum)
I value your feedback and you entrusting us with your care. If you get a [facility] patient survey, I would appreciate you taking the time to let us know about your experience today. Thank you!  [facility] ([place])--[phone]  

## 2023-08-20 ENCOUNTER — Other Ambulatory Visit: Payer: Self-pay | Admitting: Family Medicine

## 2023-08-20 DIAGNOSIS — I10 Essential (primary) hypertension: Secondary | ICD-10-CM

## 2023-09-02 ENCOUNTER — Other Ambulatory Visit: Payer: Self-pay | Admitting: Family Medicine

## 2023-09-02 DIAGNOSIS — I10 Essential (primary) hypertension: Secondary | ICD-10-CM

## 2023-09-09 ENCOUNTER — Encounter: Payer: Self-pay | Admitting: Family Medicine

## 2023-10-14 ENCOUNTER — Encounter: Payer: Self-pay | Admitting: Gastroenterology

## 2023-10-15 ENCOUNTER — Ambulatory Visit
Admission: RE | Admit: 2023-10-15 | Discharge: 2023-10-15 | Disposition: A | Discharge status: Home / Self Care | Payer: BC Managed Care – PPO | Attending: Gastroenterology | Admitting: Gastroenterology

## 2023-10-15 ENCOUNTER — Ambulatory Visit: Payer: BC Managed Care – PPO | Admitting: Anesthesiology

## 2023-10-15 ENCOUNTER — Encounter: Payer: Self-pay | Admitting: Gastroenterology

## 2023-10-15 ENCOUNTER — Encounter
Admission: RE | Disposition: A | Discharge status: Home / Self Care | Payer: Self-pay | Source: Home / Self Care | Attending: Gastroenterology

## 2023-10-15 ENCOUNTER — Other Ambulatory Visit: Payer: Self-pay

## 2023-10-15 DIAGNOSIS — Z8249 Family history of ischemic heart disease and other diseases of the circulatory system: Secondary | ICD-10-CM | POA: Diagnosis not present

## 2023-10-15 DIAGNOSIS — Z5982 Transportation insecurity: Secondary | ICD-10-CM | POA: Diagnosis not present

## 2023-10-15 DIAGNOSIS — Z1211 Encounter for screening for malignant neoplasm of colon: Secondary | ICD-10-CM | POA: Diagnosis not present

## 2023-10-15 DIAGNOSIS — D122 Benign neoplasm of ascending colon: Secondary | ICD-10-CM

## 2023-10-15 DIAGNOSIS — D123 Benign neoplasm of transverse colon: Secondary | ICD-10-CM | POA: Insufficient documentation

## 2023-10-15 DIAGNOSIS — I10 Essential (primary) hypertension: Secondary | ICD-10-CM | POA: Insufficient documentation

## 2023-10-15 DIAGNOSIS — K635 Polyp of colon: Secondary | ICD-10-CM | POA: Diagnosis not present

## 2023-10-15 DIAGNOSIS — Z6841 Body Mass Index (BMI) 40.0 and over, adult: Secondary | ICD-10-CM | POA: Insufficient documentation

## 2023-10-15 HISTORY — PX: POLYPECTOMY: SHX5525

## 2023-10-15 HISTORY — PX: COLONOSCOPY WITH PROPOFOL: SHX5780

## 2023-10-15 LAB — POCT PREGNANCY, URINE: Preg Test, Ur: NEGATIVE

## 2023-10-15 SURGERY — COLONOSCOPY WITH PROPOFOL
Anesthesia: General

## 2023-10-15 MED ORDER — PROPOFOL 1000 MG/100ML IV EMUL
INTRAVENOUS | Status: AC
  Filled 2023-10-15: qty 100

## 2023-10-15 MED ORDER — PROPOFOL 10 MG/ML IV BOLUS
INTRAVENOUS | Status: DC | PRN
  Administered 2023-10-15: 100 mg via INTRAVENOUS

## 2023-10-15 MED ORDER — LIDOCAINE HCL (CARDIAC) PF 100 MG/5ML IV SOSY
PREFILLED_SYRINGE | INTRAVENOUS | Status: DC | PRN
  Administered 2023-10-15: 40 mg via INTRAVENOUS

## 2023-10-15 MED ORDER — PROPOFOL 500 MG/50ML IV EMUL
INTRAVENOUS | Status: DC | PRN
  Administered 2023-10-15: 125 ug/kg/min via INTRAVENOUS

## 2023-10-15 MED ORDER — PHENYLEPHRINE 80 MCG/ML (10ML) SYRINGE FOR IV PUSH (FOR BLOOD PRESSURE SUPPORT)
PREFILLED_SYRINGE | INTRAVENOUS | Status: DC | PRN
  Administered 2023-10-15: 160 ug via INTRAVENOUS

## 2023-10-15 MED ORDER — DEXMEDETOMIDINE HCL IN NACL 80 MCG/20ML IV SOLN
INTRAVENOUS | Status: AC
  Filled 2023-10-15: qty 20

## 2023-10-15 MED ORDER — LIDOCAINE HCL (PF) 2 % IJ SOLN
INTRAMUSCULAR | Status: AC
  Filled 2023-10-15: qty 5

## 2023-10-15 MED ORDER — SODIUM CHLORIDE 0.9 % IV SOLN
INTRAVENOUS | Status: DC
Start: 2023-10-15 — End: 2023-10-15

## 2023-10-15 NOTE — Anesthesia Procedure Notes (Signed)
Date/Time: 10/15/2023 7:56 AM  Performed by: Stormy Fabian, CRNAPre-anesthesia Checklist: Patient identified, Emergency Drugs available, Suction available and Patient being monitored Patient Re-evaluated:Patient Re-evaluated prior to induction Oxygen Delivery Method: Supernova nasal CPAP Induction Type: IV induction Dental Injury: Teeth and Oropharynx as per pre-operative assessment  Comments: Nasal cannula with etCO2 monitoring

## 2023-10-15 NOTE — Op Note (Signed)
[facility] Patient Name: Alexis Miles Procedure Date: 10/15/2023 7:08 AM MRN: [id number] Account #: [id number] Date of Birth: 1976-02-09 Admit Type: Outpatient Age: 47 Room: [facility] ENDO ROOM 3 Gender: Female Note Status: Finalized Instrument Name: Prentice Docker [id number] Procedure:             Colonoscopy Indications:           Screening for colorectal malignant neoplasm, This is                         the patient's first colonoscopy Providers:             Toney Reil MD, MD Referring MD:          Daryl Eastern. Suzie Portela (Referring MD) Medicines:             General Anesthesia Complications:         No immediate complications. Estimated blood loss: None. Procedure:             Pre-Anesthesia Assessment:                        - Prior to the procedure, a History and Physical was                         performed, and patient medications and allergies were                         reviewed. The patient is competent. The risks and                         benefits of the procedure and the sedation options and                         risks were discussed with the patient. All questions                         were answered and informed consent was obtained.                         Patient identification and proposed procedure were                         verified by the physician, the nurse, the                         anesthesiologist, the anesthetist and the technician                         in the pre-procedure area in the procedure room in the                         endoscopy suite. Mental Status Examination: alert and                         oriented. Airway Examination: normal oropharyngeal                         airway and neck mobility. Respiratory Examination:  clear to auscultation. CV Examination: normal.                         Prophylactic Antibiotics: The patient does not require                         prophylactic  antibiotics. Prior Anticoagulants: The                         patient has taken no anticoagulant or antiplatelet                         agents. ASA Grade Assessment: III - A patient with                         severe systemic disease. After reviewing the risks and                         benefits, the patient was deemed in satisfactory                         condition to undergo the procedure. The anesthesia                         plan was to use general anesthesia. Immediately prior                         to administration of medications, the patient was                         re-assessed for adequacy to receive sedatives. The                         heart rate, respiratory rate, oxygen saturations,                         blood pressure, adequacy of pulmonary ventilation, and                         response to care were monitored throughout the                         procedure. The physical status of the patient was                         re-assessed after the procedure.                        After obtaining informed consent, the colonoscope was                         passed under direct vision. Throughout the procedure,                         the patient's blood pressure, pulse, and oxygen                         saturations were monitored continuously. The  Colonoscope was introduced through the anus and                         advanced to the the cecum, identified by appendiceal                         orifice and ileocecal valve. The colonoscopy was                         performed without difficulty. The patient tolerated                         the procedure well. The quality of the bowel                         preparation was evaluated using the BBPS [facility] Bowel                         Preparation Scale) with scores of: Right Colon = 3,                         Transverse Colon = 3 and Left Colon = 3 (entire mucosa                         seen  well with no residual staining, small fragments                         of stool or opaque liquid). The total BBPS score                         equals 9. The ileocecal valve, appendiceal orifice,                         and rectum were photographed. Findings:      The perianal and digital rectal examinations were normal. Pertinent       negatives include normal sphincter tone and no palpable rectal lesions.      A diminutive polyp was found in the proximal ascending colon. The polyp       was sessile. The polyp was removed with a jumbo cold forceps. Resection       and retrieval were complete. Estimated blood loss: none.      A 4 mm polyp was found in the transverse colon. The polyp was sessile.       The polyp was removed with a cold snare. Resection and retrieval were       complete. Estimated blood loss: none.      The retroflexed view of the distal rectum and anal verge was normal and       showed no anal or rectal abnormalities. Impression:            - One diminutive polyp in the proximal ascending                         colon, removed with a jumbo cold forceps. Resected and                         retrieved.                        -  One 4 mm polyp in the transverse colon, removed with                         a cold snare. Resected and retrieved.                        - The distal rectum and anal verge are normal on                         retroflexion view. Recommendation:        - Discharge patient to home (with escort).                        - Resume previous diet today.                        - Continue present medications.                        - Await pathology results.                        - Repeat colonoscopy in 7-10 years for surveillance                         based on pathology results. Procedure Code(s):     --- Professional ---                        [id number], Colonoscopy, flexible; with removal of                         tumor(s), polyp(s), or other lesion(s) by  snare                         technique                        45380, 59, Colonoscopy, flexible; with biopsy, single                         or multiple Diagnosis Code(s):     --- Professional ---                        Z12.11, Encounter for screening for malignant neoplasm                         of colon                        D12.2, Benign neoplasm of ascending colon                        D12.3, Benign neoplasm of transverse colon (hepatic                         flexure or splenic flexure) CPT copyright 2022 American Medical Association. All rights reserved. The codes documented in this report are preliminary and upon coder review may  be revised to meet current compliance requirements. Dr. Libby Maw Toney Reil MD, MD 10/15/2023 8:14:16 AM This  report has been signed electronically. Number of Addenda: 0 Note Initiated On: 10/15/2023 7:08 AM Scope Withdrawal Time: 0 hours 15 minutes 21 seconds  Total Procedure Duration: 0 hours 17 minutes 42 seconds  Estimated Blood Loss:  Estimated blood loss: none.      [facility]

## 2023-10-15 NOTE — H&P (Signed)
Arlyss Repress, MD [address]  [address]  [place], [place] [address]  Main: [phone]  Fax: [phone] Pager: [phone]  Primary Care Physician:  [name], FNP Primary Gastroenterologist:  Dr. Arlyss Repress  Pre-Procedure History & Physical: HPI:  Alexis Miles is a 47 y.o. female is here for an colonoscopy.   Past Medical History:  Diagnosis Date   Anxiety    Hypertension    PCOS (polycystic ovarian syndrome)     Past Surgical History:  Procedure Laterality Date   BARTHOLIN GLAND CYST EXCISION     MOUTH SURGERY     wisdom teeth removed    Prior to Admission medications   Medication Sig Start Date End Date Taking? Authorizing Provider  losartan-hydrochlorothiazide (HYZAAR) 100-25 MG tablet TAKE 1 TABLET BY MOUTH DAILY 09/03/23  Yes Merita Norton T, FNP  metFORMIN (GLUCOPHAGE) 500 MG tablet Take 1 tablet (500 mg total) by mouth daily with breakfast. 05/02/22  Yes [name], FNP  Vitamin D, Ergocalciferol, (DRISDOL) 1.25 MG (50000 UNIT) CAPS capsule Take 1 capsule (50,000 Units total) by mouth every 7 (seven) days. 05/03/22  Yes [name], FNP  levonorgestrel (MIRENA) 20 MCG/DAY IUD 1 each by Intrauterine route once. 10/16/22   Linzie Collin, MD  Na Sulfate-K Sulfate-Mg Sulf 17.5-3.13-1.6 GM/177ML SOLN See admin instructions. Patient not taking: Reported on 07/11/2023 06/19/23   [provider]  sertraline (ZOLOFT) 100 MG tablet Take 1 tablet (100 mg total) by mouth daily. Patient not taking: Reported on 10/15/2023 10/25/22   [name], FNP    Allergies as of 06/19/2023 - Review Complete 05/24/2023  Allergen Reaction Noted   Penicillins Other (See Comments) 12/28/2015   Trazodone and nefazodone Other (See Comments) 01/27/2016    Family History  Problem Relation Age of Onset   Arthritis Mother    Lymphoma Mother    Heart disease Father        MI, CABG, CAD   Diabetes Father    Skin cancer Father        3 times    Hyperlipidemia Sister    Depression Sister    Anxiety disorder Sister    Hypertension Sister    Depression Sister    Anxiety disorder Sister    Diabetes Maternal Grandmother    Breast cancer Maternal Grandmother        8s   Colon cancer Maternal Grandmother    Skin cancer Paternal Grandmother    Colon cancer Paternal Grandmother    Alcohol abuse Paternal Grandfather    Diabetes Maternal Aunt    Lymphoma Maternal Aunt    Cervical cancer Maternal Aunt        unsure of age   Skin cancer Maternal Uncle        multiple times    Social History   Socioeconomic History   Marital status: Single    Spouse name: Not on file   Number of children: 0   Years of education: College   Highest education level: Bachelor's degree (e.g., BA, AB, BS)  Occupational History   Occupation: Full Time    Employer: [employer]    Comment: [employer]  Tobacco Use   Smoking status: Never   Smokeless tobacco: Never  Vaping Use   Vaping status: Never Used  Substance and Sexual Activity   Alcohol use: Yes    Comment: occ   Drug use: No   Sexual activity: Yes    Birth  control/protection: I.U.D.  Other Topics Concern   Not on file  Social History Narrative   Not on file   Social Determinants of Health   Financial Resource Strain: Low Risk  (08/13/2018)   Overall Financial Resource Strain (CARDIA)    Difficulty of Paying Living Expenses: Not hard at all  Food Insecurity: No Food Insecurity (08/13/2018)   Hunger Vital Sign    Worried About Running Out of Food in the Last Year: Never true    Ran Out of Food in the Last Year: Never true  Transportation Needs: Unmet Transportation Needs (08/13/2018)   PRAPARE - Administrator, Civil Service (Medical): Yes    Lack of Transportation (Non-Medical): Yes  Physical Activity: Inactive (08/13/2018)   Exercise Vital Sign    Days of Exercise per Week: 0 days    Minutes of Exercise per Session: 0 min  Stress: Stress Concern Present  (05/24/2023)   [employer] of Occupational Health - Occupational Stress Questionnaire    Feeling of Stress : To some extent  Social Connections: Somewhat Isolated (08/13/2018)   Social Connection and Isolation Panel [NHANES]    Frequency of Communication with Friends and Family: More than three times a week    Frequency of Social Gatherings with Friends and Family: More than three times a week    Attends Religious Services: More than 4 times per year    Active Member of [employer] or Organizations: No    Attends Banker Meetings: Never    Marital Status: Never married  Intimate Partner Violence: Not At Risk (08/13/2018)   Humiliation, Afraid, Rape, and Kick questionnaire    Fear of Current or Ex-Partner: No    Emotionally Abused: No    Physically Abused: No    Sexually Abused: No    Review of Systems: See HPI, otherwise negative ROS  Physical Exam: BP (!) 143/87   Pulse 94   Temp (!) 96.5 F (35.8 C) (Temporal)   Resp 20   Ht 5' 5.5" (1.664 m)   Wt (!) 157.8 kg   SpO2 99%   BMI 57.00 kg/m  General:   Alert,  pleasant and cooperative in NAD Head:  Normocephalic and atraumatic. Neck:  Supple; no masses or thyromegaly. Lungs:  Clear throughout to auscultation.    Heart:  Regular rate and rhythm. Abdomen:  Soft, nontender and nondistended. Normal bowel sounds, without guarding, and without rebound.   Neurologic:  Alert and  oriented x4;  grossly normal neurologically.  Impression/Plan: Alexis Miles is here for an colonoscopy to be performed for colon cancer screening  Risks, benefits, limitations, and alternatives regarding  colonoscopy have been reviewed with the patient.  Questions have been answered.  All parties agreeable.   Lannette Donath, MD  10/15/2023, 7:42 AM

## 2023-10-15 NOTE — Anesthesia Postprocedure Evaluation (Signed)
Anesthesia Post Note  Patient: Alexis Miles  Procedure(s) Performed: COLONOSCOPY WITH PROPOFOL POLYPECTOMY  Patient location during evaluation: Endoscopy Anesthesia Type: General Level of consciousness: awake and alert Pain management: pain level controlled Vital Signs Assessment: post-procedure vital signs reviewed and stable Respiratory status: spontaneous breathing, nonlabored ventilation and respiratory function stable Cardiovascular status: blood pressure returned to baseline and stable Postop Assessment: no apparent nausea or vomiting Anesthetic complications: no   No notable events documented.   Last Vitals:  Vitals:   10/15/23 0815 10/15/23 0825  BP: (!) 100/58 118/82  Pulse: 82   Resp: (!) 27   Temp: (!) 35.8 C   SpO2: 95%     Last Pain:  Vitals:   10/15/23 0825  TempSrc:   PainSc: 0-No pain                 Foye Deer

## 2023-10-15 NOTE — Transfer of Care (Signed)
Immediate Anesthesia Transfer of Care Note  Patient: Alexis Miles  Procedure(s) Performed: Procedure(s): COLONOSCOPY WITH PROPOFOL (N/A) POLYPECTOMY  Patient Location: PACU and Endoscopy Unit  Anesthesia Type:General  Level of Consciousness: sedated  Airway & Oxygen Therapy: Patient Spontanous Breathing and Patient connected to nasal cannula oxygen  Post-op Assessment: Report given to RN and Post -op Vital signs reviewed and stable  Post vital signs: Reviewed and stable  Last Vitals:  Vitals:   10/15/23 0704 10/15/23 0815  BP: (!) 143/87 (!) 100/58  Pulse: 94 82  Resp: 20 (!) 27  Temp: (!) 35.8 C   SpO2: 99% 95%    Complications: No apparent anesthesia complications

## 2023-10-15 NOTE — Anesthesia Preprocedure Evaluation (Addendum)
Anesthesia Evaluation  Patient identified by MRN, date of birth, ID band Patient awake    Reviewed: Allergy & Precautions, H&P , NPO status , Patient's Chart, lab work & pertinent test results  Airway Mallampati: III  TM Distance: >3 FB Neck ROM: full    Dental no notable dental hx.    Pulmonary neg pulmonary ROS   Pulmonary exam normal        Cardiovascular hypertension, Normal cardiovascular exam     Neuro/Psych  PSYCHIATRIC DISORDERS Anxiety     negative neurological ROS     GI/Hepatic negative GI ROS, Neg liver ROS,,,  Endo/Other    Morbid obesity  Renal/GU negative Renal ROS  negative genitourinary   Musculoskeletal   Abdominal Normal abdominal exam  (+)   Peds  Hematology negative hematology ROS (+)   Anesthesia Other Findings Past Medical History: No date: Anxiety No date: Hypertension No date: PCOS (polycystic ovarian syndrome)  Past Surgical History: No date: BARTHOLIN GLAND CYST EXCISION No date: MOUTH SURGERY     Comment:  wisdom teeth removed     Reproductive/Obstetrics negative OB ROS                             Anesthesia Physical Anesthesia Plan  ASA: 3  Anesthesia Plan: General   Post-op Pain Management:    Induction: Intravenous  PONV Risk Score and Plan: Propofol infusion and TIVA  Airway Management Planned: Natural Airway  Additional Equipment:   Intra-op Plan:   Post-operative Plan:   Informed Consent: I have reviewed the patients History and Physical, chart, labs and discussed the procedure including the risks, benefits and alternatives for the proposed anesthesia with the patient or authorized representative who has indicated his/her understanding and acceptance.     Dental Advisory Given  Plan Discussed with: CRNA and Surgeon  Anesthesia Plan Comments:         Anesthesia Quick Evaluation

## 2023-10-15 NOTE — Anesthesia Procedure Notes (Signed)
Date/Time: 10/15/2023 7:48 AM  Performed by: Stormy Fabian, CRNAPre-anesthesia Checklist: Patient identified, Emergency Drugs available, Suction available and Patient being monitored Patient Re-evaluated:Patient Re-evaluated prior to induction Oxygen Delivery Method: Nasal cannula Induction Type: IV induction Dental Injury: Teeth and Oropharynx as per pre-operative assessment  Comments: Nasal cannula with etCO2 monitoring

## 2023-10-16 ENCOUNTER — Encounter: Payer: Self-pay | Admitting: Gastroenterology

## 2023-10-16 LAB — SURGICAL PATHOLOGY

## 2023-10-18 ENCOUNTER — Encounter: Payer: Self-pay | Admitting: Gastroenterology

## 2024-04-16 ENCOUNTER — Other Ambulatory Visit: Payer: Self-pay | Admitting: Obstetrics and Gynecology

## 2024-04-16 DIAGNOSIS — Z1231 Encounter for screening mammogram for malignant neoplasm of breast: Secondary | ICD-10-CM

## 2024-06-05 ENCOUNTER — Ambulatory Visit
Admission: RE | Admit: 2024-06-05 | Discharge: 2024-06-05 | Disposition: A | Discharge status: Home / Self Care | Payer: Self-pay | Source: Ambulatory Visit | Attending: Obstetrics and Gynecology | Admitting: Obstetrics and Gynecology

## 2024-06-05 DIAGNOSIS — Z1231 Encounter for screening mammogram for malignant neoplasm of breast: Secondary | ICD-10-CM | POA: Insufficient documentation

## 2024-06-09 ENCOUNTER — Ambulatory Visit: Payer: Self-pay | Admitting: Obstetrics and Gynecology

## 2024-06-26 ENCOUNTER — Encounter: Payer: Self-pay | Admitting: Family Medicine

## 2024-06-26 ENCOUNTER — Ambulatory Visit: Admitting: Family Medicine

## 2024-06-26 VITALS — BP 124/74 | HR 83 | Resp 16 | Ht 65.0 in | Wt 348.4 lb

## 2024-06-26 DIAGNOSIS — K760 Fatty (change of) liver, not elsewhere classified: Secondary | ICD-10-CM

## 2024-06-26 DIAGNOSIS — E782 Mixed hyperlipidemia: Secondary | ICD-10-CM

## 2024-06-26 DIAGNOSIS — R202 Paresthesia of skin: Secondary | ICD-10-CM

## 2024-06-26 DIAGNOSIS — Z6841 Body Mass Index (BMI) 40.0 and over, adult: Secondary | ICD-10-CM

## 2024-06-26 DIAGNOSIS — R7303 Prediabetes: Secondary | ICD-10-CM

## 2024-06-26 DIAGNOSIS — I10 Essential (primary) hypertension: Secondary | ICD-10-CM

## 2024-06-26 DIAGNOSIS — Z713 Dietary counseling and surveillance: Secondary | ICD-10-CM

## 2024-06-26 DIAGNOSIS — Z0000 Encounter for general adult medical examination without abnormal findings: Secondary | ICD-10-CM | POA: Diagnosis not present

## 2024-06-26 DIAGNOSIS — E559 Vitamin D deficiency, unspecified: Secondary | ICD-10-CM

## 2024-06-26 DIAGNOSIS — Z789 Other specified health status: Secondary | ICD-10-CM

## 2024-06-26 DIAGNOSIS — E282 Polycystic ovarian syndrome: Secondary | ICD-10-CM | POA: Diagnosis not present

## 2024-06-26 MED ORDER — BUPROPION HCL ER (XL) 150 MG PO TB24: ORAL_TABLET | ORAL | 1 refills | Status: AC

## 2024-06-26 MED ORDER — SERTRALINE HCL 25 MG PO TABS: 25.0000 mg | ORAL_TABLET | Freq: Every day | ORAL | 3 refills | Status: DC

## 2024-06-26 MED ORDER — VITAMIN D (ERGOCALCIFEROL) 1.25 MG (50000 UNIT) PO CAPS
50000.0000 [IU] | ORAL_CAPSULE | ORAL | 3 refills | Status: AC
Start: 2024-06-26 — End: ?

## 2024-06-26 MED ORDER — NALTREXONE HCL 50 MG PO TABS: 25.0000 mg | ORAL_TABLET | Freq: Every day | ORAL | 1 refills | Status: AC

## 2024-06-26 MED ORDER — LOSARTAN POTASSIUM-HCTZ 100-25 MG PO TABS: 1.0000 | ORAL_TABLET | Freq: Every day | ORAL | 1 refills | Status: AC

## 2024-06-26 NOTE — Patient Instructions (Signed)
 Increase your intake of fresh/frozen fruits and nonstarchy vegetables.  The Mediterranean diet is a great reference for meal ideas.    -- Avoid pineapples, bananas and grapes and diabetes as the sugars are too readily available and increase your blood sugar quickly.  Incorporate exercise into your daily routine (at least 30-60 minutes per day, most days of the week) and monitor your caloric intake, restricting it to around 2000 calories per day.  - I encourage you to measure/weigh your foods/beverages for a few days to get a more accurate idea of what different amounts of things look like on your plate or in your glass.  - Using smaller plates/glasses also helps in that it tricks our minds into thinking we've eaten more than we have. Studies have shown that we eat more when presented with larger plates, even with the same amount of food on them.   - Plan to eat until you are no longer hungry, rather than until you're full.  If you don't normally exercise, start with something simple or more enjoyable. You can plan to walk for your half hour or do something like dancing if you enjoy it.  Build up your activity over time; this will make it more enjoyable and reduce your risk of injury.  - Incorporating small increases in activity, such as taking stairs or parking further away from entrances to walk more, also goes a long way toward improving your overall health.   Here are some videos which offer a variety of different workout classes with different levels: [email or web address].[email or web address]  It is completely free. If a full video is too much for you to do, you can do them in bite-sized chunks (just pausing when needed and resuming later in the day).   Even if these actions don't ultimately lead to weight loss, increasing your activity will still help to reduce your insulin resistance and will improve your cardiovascular health (making heart attack/stroke less likely as you age).

## 2024-06-26 NOTE — Progress Notes (Addendum)
 Complete physical exam   Patient: Alexis Miles   DOB: 22-Apr-1976   48 y.o. Female  MRN: [id number] Visit Date: 06/26/2024  Today's healthcare provider: LAURAINE LOISE BUOY, DO   Chief Complaint  Patient presents with   Transitions Of Care    Previous provider Kelly Cedar   Annual Exam    Sleeping pattern: Stressors this week. Other wise good Exercise: No No concerns   Subjective    Alexis Miles is a 48 y.o. female who presents today for a complete physical exam.  She reports consuming a general diet but going to be changing to low carb soon due to fiance's DM diagnosis. The patient does not participate in regular exercise at present. She generally feels fairly well. She reports sleeping fairly well. She does have additional problems to discuss today.    HPI HPI     Transitions Of Care    Additional comments: Previous provider Kelly Cedar        Annual Exam    Additional comments: Sleeping pattern: Stressors this week. Other wise good Exercise: No No concerns      Last edited by Wilfred Hargis RAMAN, CMA on 06/26/2024 10:53 AM.       Alexis Miles Alexis Miles is a 48 year old female who presents for an annual physical exam and to discuss fatty liver.  She is concerned about fatty liver and has made recent lifestyle changes due to her fianc's new diagnosis of diabetes, prompting dietary adjustments towards lower sugar and carbohydrate intake.  She has a history of hypertension and is currently taking losartan . She has been inconsistent with her medication adherence in the past but has recently been more diligent. She experiences numbness and tingling in her feet, which she has noticed are more noticeable when her feet are less swollen.  She has a history of polycystic ovary syndrome (PCOS) and was previously on metformin  to manage her blood sugar levels, as she is often in the prediabetic range. She is not currently taking metformin .  She reports a history of anxiety,  which has been exacerbated in the past week due to personal and professional stressors. She has been on various mental health medications in the past, including sertraline , Lexapro , Xanax , and buspirone , but is not currently on any medication for anxiety.  She has a history of vitamin D  deficiency and acknowledges not taking her supplements regularly. Her vitamin D  levels have been chronically low, and she has not refilled her prescription since 2023.  No history of anemia or chronic fatigue, attributing any tiredness to being overweight. She experiences occasional migraines, which she can usually manage by resting in a dark room. No recent migraines.  She experiences blurred vision for near objects, managed with reading glasses, and denies any issues with distant vision. Occasional nausea and diarrhea, attributed to recent anxiety, but no vomiting or abdominal pain.     Past Medical History:  Diagnosis Date   Anxiety    Hypertension    PCOS (polycystic ovarian syndrome)    Past Surgical History:  Procedure Laterality Date   BARTHOLIN GLAND CYST EXCISION     COLONOSCOPY WITH PROPOFOL  N/A 10/15/2023   Procedure: COLONOSCOPY WITH PROPOFOL ;  Surgeon: Unk Corinn Skiff, MD;  Location: [facility];  Service: Gastroenterology;  Laterality: N/A;   MOUTH SURGERY     wisdom teeth removed   POLYPECTOMY  10/15/2023   Procedure: POLYPECTOMY;  Surgeon: Unk Corinn Skiff, MD;  Location: [facility];  Service:  Gastroenterology;;   Social History   Socioeconomic History   Marital status: Single    Spouse name: Not on file   Number of children: 0   Years of education: College   Highest education level: Bachelor's degree (e.g., BA, AB, BS)  Occupational History   Occupation: Full Time    Employer: [employer]    Comment: [employer]  Tobacco Use   Smoking status: Never   Smokeless tobacco: Never  Vaping Use   Vaping status: Never Used  Substance and Sexual Activity    Alcohol use: Yes    Alcohol/week: 1.0 standard drink of alcohol    Types: 1 Glasses of wine per week    Comment: occ   Drug use: No   Sexual activity: Yes    Birth control/protection: I.U.D.  Other Topics Concern   Not on file  Social History Narrative   Not on file   Social Drivers of Health   Financial Resource Strain: Low Risk  (06/22/2024)   Overall Financial Resource Strain (CARDIA)    Difficulty of Paying Living Expenses: Not very hard  Food Insecurity: No Food Insecurity (06/22/2024)   Hunger Vital Sign    Worried About Running Out of Food in the Last Year: Never true    Ran Out of Food in the Last Year: Never true  Transportation Needs: No Transportation Needs (06/22/2024)   PRAPARE - Administrator, Civil Service (Medical): No    Lack of Transportation (Non-Medical): No  Physical Activity: Inactive (06/22/2024)   Exercise Vital Sign    Days of Exercise per Week: 0 days    Minutes of Exercise per Session: Not on file  Stress: Stress Concern Present (06/22/2024)   [employer] of Occupational Health - Occupational Stress Questionnaire    Feeling of Stress: Very much  Social Connections: Moderately Isolated (06/22/2024)   Social Connection and Isolation Panel    Frequency of Communication with Friends and Family: Once a week    Frequency of Social Gatherings with Friends and Family: Once a week    Attends Religious Services: More than 4 times per year    Active Member of [employer] or Organizations: No    Attends Banker Meetings: Not on file    Marital Status: Living with partner  Intimate Partner Violence: Not At Risk (06/26/2024)   Humiliation, Afraid, Rape, and Kick questionnaire    Fear of Current or Ex-Partner: No    Emotionally Abused: No    Physically Abused: No    Sexually Abused: No   Family Status  Relation Name Status   Mother  Deceased   Father  Alive   Sister  Alive   Sister  Alive   MGM  Deceased   PGM  Deceased   PGF   Deceased       MI   Mat Aunt  Alive   Mat Aunt  Deceased   Mat Uncle  Alive  No partnership data on file   Family History  Problem Relation Age of Onset   Arthritis Mother    Lymphoma Mother    Heart disease Father        MI, CABG, CAD   Diabetes Father    Skin cancer Father        3 times   Hyperlipidemia Sister    Depression Sister    Anxiety disorder Sister    Hypertension Sister    Depression Sister    Anxiety disorder Sister  Diabetes Maternal Grandmother    Breast cancer Maternal Grandmother        1s   Colon cancer Maternal Grandmother    Skin cancer Paternal Grandmother    Colon cancer Paternal Grandmother    Alcohol abuse Paternal Grandfather    Diabetes Maternal Aunt    Lymphoma Maternal Aunt    Cervical cancer Maternal Aunt        unsure of age   Skin cancer Maternal Uncle        multiple times   Allergies  Allergen Reactions   Penicillins Other (See Comments)    Has patient had a PCN reaction causing immediate rash, facial/tongue/throat swelling, SOB or lightheadedness with hypotension: Unknown Has patient had a PCN reaction causing severe rash involving mucus membranes or skin necrosis: Unknown Has patient had a PCN reaction that required hospitalization: Unknown Has patient had a PCN reaction occurring within the last 10 years: Unknown If all of the above answers are NO, then may proceed with Cephalosporin use.    Trazodone  And Nefazodone Other (See Comments)    Suicidal Ideation    Patient Care Team: Donzella Lauraine SAILOR, DO as PCP - General (Family Medicine) Gasper Nancyann BRAVO, MD as Referring Physician (Family Medicine)   Medications: Outpatient Medications Prior to Visit  Medication Sig   levonorgestrel  (MIRENA ) 20 MCG/DAY IUD 1 each by Intrauterine route once.   [DISCONTINUED] losartan -hydrochlorothiazide (HYZAAR) 100-25 MG tablet TAKE 1 TABLET BY MOUTH DAILY   [DISCONTINUED] Vitamin D , Ergocalciferol , (DRISDOL ) 1.25 MG (50000 UNIT) CAPS  capsule Take 1 capsule (50,000 Units total) by mouth every 7 (seven) days.   [DISCONTINUED] metFORMIN  (GLUCOPHAGE ) 500 MG tablet Take 1 tablet (500 mg total) by mouth daily with breakfast.   No facility-administered medications prior to visit.    Review of Systems  Constitutional:  Negative for chills, fatigue and fever.  HENT:  Negative for congestion, ear pain, rhinorrhea, sneezing and sore throat.   Eyes: Negative.  Negative for pain and redness.  Respiratory:  Negative for cough, shortness of breath and wheezing.   Cardiovascular:  Negative for chest pain and leg swelling.  Gastrointestinal:  Negative for abdominal pain, blood in stool, constipation, diarrhea and nausea.  Endocrine: Negative for polydipsia and polyphagia.  Genitourinary: Negative.  Negative for dysuria, flank pain, hematuria, pelvic pain, vaginal bleeding and vaginal discharge.  Musculoskeletal:  Negative for arthralgias, back pain, gait problem and joint swelling.  Skin:  Negative for rash.  Neurological:  Positive for headaches (intermittent migraines (very infrequent at this point)). Negative for dizziness, tremors, seizures, weakness, light-headedness and numbness.  Hematological:  Negative for adenopathy.  Psychiatric/Behavioral: Negative.  Negative for behavioral problems, confusion and dysphoric mood. The patient is not nervous/anxious and is not hyperactive.       Objective    BP 124/74 (BP Location: Right Arm, Patient Position: Sitting, Cuff Size: Large)   Pulse 83   Resp 16   Ht 5' 5 (1.651 m)   Wt (!) 348 lb 6.4 oz (158 kg)   SpO2 97%   BMI 57.98 kg/m    Physical Exam Vitals and nursing note reviewed.  Constitutional:      General: She is awake.     Appearance: Normal appearance. She is morbidly obese.  HENT:     Head: Normocephalic and atraumatic.     Right Ear: Tympanic membrane, ear canal and external ear normal.     Left Ear: Tympanic membrane, ear canal and external ear normal.  Nose: Nose normal.     Mouth/Throat:     Mouth: Mucous membranes are moist.     Pharynx: Oropharynx is clear. No oropharyngeal exudate or posterior oropharyngeal erythema.  Eyes:     General: No scleral icterus.    Extraocular Movements: Extraocular movements intact.     Conjunctiva/sclera: Conjunctivae normal.     Pupils: Pupils are equal, round, and reactive to light.  Neck:     Thyroid: No thyromegaly or thyroid tenderness.  Cardiovascular:     Rate and Rhythm: Normal rate and regular rhythm.     Pulses: Normal pulses.     Heart sounds: Normal heart sounds.  Pulmonary:     Effort: Pulmonary effort is normal. No tachypnea, bradypnea or respiratory distress.     Breath sounds: Normal breath sounds. No stridor. No wheezing, rhonchi or rales.  Abdominal:     General: Bowel sounds are normal. There is no distension.     Palpations: Abdomen is soft. There is no mass.     Tenderness: There is no abdominal tenderness. There is no guarding.     Hernia: No hernia is present.  Musculoskeletal:     Cervical back: Normal range of motion and neck supple.     Right lower leg: No edema.     Left lower leg: No edema.  Lymphadenopathy:     Cervical: No cervical adenopathy.  Skin:    General: Skin is warm and dry.  Neurological:     Mental Status: She is alert and oriented to person, place, and time. Mental status is at baseline.  Psychiatric:        Mood and Affect: Mood normal.        Behavior: Behavior normal.      Last depression screening scores    06/26/2024   10:57 AM 05/24/2023    1:31 PM 10/25/2022    8:04 AM  PHQ 2/9 Scores  PHQ - 2 Score 3 1 0  PHQ- 9 Score 12 7 3    Last fall risk screening    06/26/2024   10:57 AM  Fall Risk   Falls in the past year? 1  Injury with Fall? 0  Risk for fall due to : No Fall Risks   Last Audit-C alcohol use screening    06/22/2024   10:18 AM  Alcohol Use Disorder Test (AUDIT)  1. How often do you have a drink containing alcohol? 1   2. How many drinks containing alcohol do you have on a typical day when you are drinking? 0  3. How often do you have six or more drinks on one occasion? 0  AUDIT-C Score 1      Patient-reported   A score of 3 or more in women, and 4 or more in men indicates increased risk for alcohol abuse, EXCEPT if all of the points are from question 1   No results found for any visits on 06/26/24.  Assessment & Plan    Routine Health Maintenance and Physical Exam  Exercise Activities and Dietary recommendations  Goals      Exercise 150 min/wk Moderate Activity     Plan meals     Eat more fruits and nonstarchy vegetables.  Avoid large quantities of bananas, grapes and pineapple due to prediabetes.        Immunization History  Administered Date(s) Administered   Influenza Split 10/01/2013   Influenza,inj,Quad PF,6+ Mos 12/28/2015, 09/05/2018, 08/30/2021   Influenza-Unspecified 10/17/2016, 10/03/2017, 09/28/2020   Moderna Sars-Covid-2 Vaccination  02/06/2020, 03/05/2020   Tdap 06/27/2018    Health Maintenance  Topic Date Due   COVID-19 Vaccine (3 - Moderna risk series) 09/09/2024 (Originally 04/02/2020)   INFLUENZA VACCINE  07/10/2024   Cervical Cancer Screening (HPV/Pap Cotest)  06/24/2025   DTaP/Tdap/Td (2 - Td or Tdap) 06/27/2028   Colonoscopy  10/14/2030   Hepatitis C Screening  Completed   HPV VACCINES  Aged Out   Meningococcal B Vaccine  Aged Out   Hepatitis B Vaccines  Discontinued   HIV Screening  Discontinued    Discussed health benefits of physical activity, and encouraged her to engage in regular exercise appropriate for her age and condition.   Annual physical exam  Transition of care  Primary hypertension -     Comprehensive metabolic panel with GFR -     Losartan  Potassium-HCTZ; Take 1 tablet by mouth daily.  Dispense: 90 tablet; Refill: 1  PCOS (polycystic ovarian syndrome)  Morbid obesity with BMI of 50.0-59.9, adult (HCC) -     buPROPion  HCl ER (XL); 150  mg orally once daily for 3 days, then increase to 300 mg/day.  Dispense: 60 tablet; Refill: 1 -     Naltrexone  HCl; Take 0.5-1 tablets (25-50 mg total) by mouth daily.  Dispense: 30 tablet; Refill: 1  Hepatic steatosis  Weight loss counseling, encounter for -     buPROPion  HCl ER (XL); 150 mg orally once daily for 3 days, then increase to 300 mg/day.  Dispense: 60 tablet; Refill: 1 -     Naltrexone  HCl; Take 0.5-1 tablets (25-50 mg total) by mouth daily.  Dispense: 30 tablet; Refill: 1  Avitaminosis D -     Vitamin D  (Ergocalciferol ); Take 1 capsule (50,000 Units total) by mouth every 7 (seven) days.  Dispense: 13 capsule; Refill: 3  Paresthesias -     Vitamin B12  Moderate mixed hyperlipidemia not requiring statin therapy -     Lipid panel  Prediabetes -     Hemoglobin A1c      Annual physical exam; transition of care Physical exam overall unremarkable except as noted above. Routine lab work ordered as noted. Lacks regular eye exams. Declined COVID booster unless mandated. Completed hepatitis B vaccination series. Verification suggested. - Encourage annual eye exams. - Discuss COVID booster annually. - Verify hepatitis B vaccination records.  Anxiety Increased anxiety due to stressors. Discussed bupropion  for management and mood improvement. - Prescribe bupropion . - Follow up in 6 weeks to assess response.  Hypertension On losartan  with increased adherence. Reports numbness and tingling in feet possibly related to losartan -hydrochlorothiazide. - Refill losartan -hydrochlorothiazide prescription. - Follow up in 6 weeks to assess blood pressure control and adherence.  Polycystic Ovary Syndrome (PCOS) with Prediabetes PCOS with prediabetes. Discussed weight loss and dietary changes. Consider metformin  reinitiation. - Encourage dietary changes to lower sugar and carbohydrate intake. - Consider reinitiating metformin  based on future assessments.  Morbid obesity (BMI  50.0-59.9); weight loss counseling Discussed patient's current dietary habits.  She has started making changes in the form of reducing her carbohydrate intake due to her fianc's recent diagnosis of diabetes.  Discussed bupropion  and naltrexone  to facilitate weight loss. - Prescribe bupropion  and naltrexone  (Contrave). - Encouraged patient to continue focusing on her lifestyle changes and maintain a caloric intake of no more than 2000 cal/day.   - Encouraged her to start increasing her physical activity by adding 5 to 10 minutes sessions throughout the day and incorporating stairs and longer walks wherever possible, with the long-term goal  of exercising at a moderate intensity for at least 150 minutes/week.  Hepatic steatosis Seen on abdominal ultrasound dated 10/16/2013.  Fatty liver with recent beneficial lifestyle changes. Fasting blood test recommended for liver function. - Order fasting blood work. - Encourage dietary changes to lower sugar and carbohydrate intake. - Encouraged increasing activity levels, starting with small, slow, sustainable changes and building up over time.  Vitamin D  Deficiency Chronically low vitamin D  levels with non-adherence to supplements.  Will not recheck vitamin D  level today due to patient only taking 61-months-worth (at most) vitamin D  prescription runs-high probability of remaining very low. - Refill and encourage regular intake of high-dose vitamin D  supplements.    Follow-up Follow-up needed for medication regimen effectiveness and lifestyle adherence. Blood work scheduled. - Follow up in 6 weeks to assess response to bupropion  and naltrexone . - Schedule fasting blood work for next available opportunity.  Return in about 6 weeks (around 08/07/2024) for Weight, Anx/Dep.     I discussed the assessment and treatment plan with the patient  The patient was provided an opportunity to ask questions and all were answered. The patient agreed with the plan and  demonstrated an understanding of the instructions.   The patient was advised to call back or seek an in-person evaluation if the symptoms worsen or if the condition fails to improve as anticipated.    LAURAINE LOISE BUOY, DO  [facility] [phone] (phone) [phone] (fax)  [facility]

## 2024-07-03 ENCOUNTER — Other Ambulatory Visit: Payer: Self-pay | Admitting: Family Medicine

## 2024-07-04 LAB — COMPREHENSIVE METABOLIC PANEL WITH GFR
ALT: 43 IU/L — ABNORMAL HIGH (ref 0–32)
AST: 31 IU/L (ref 0–40)
Albumin: 4.1 g/dL (ref 3.9–4.9)
Alkaline Phosphatase: 82 IU/L (ref 44–121)
BUN/Creatinine Ratio: 18 (ref 9–23)
BUN: 24 mg/dL (ref 6–24)
Bilirubin Total: 0.7 mg/dL (ref 0.0–1.2)
CO2: 24 mmol/L (ref 20–29)
Calcium: 9.8 mg/dL (ref 8.7–10.2)
Chloride: 95 mmol/L — ABNORMAL LOW (ref 96–106)
Creatinine, Ser: 1.37 mg/dL — ABNORMAL HIGH (ref 0.57–1.00)
Globulin, Total: 3.4 g/dL (ref 1.5–4.5)
Glucose: 114 mg/dL — ABNORMAL HIGH (ref 70–99)
Potassium: 4.2 mmol/L (ref 3.5–5.2)
Sodium: 136 mmol/L (ref 134–144)
Total Protein: 7.5 g/dL (ref 6.0–8.5)
eGFR: 48 mL/min/1.73 — ABNORMAL LOW (ref >59)

## 2024-07-04 LAB — HEMOGLOBIN A1C
Est. average glucose Bld gHb Est-mCnc: 128 mg/dL
Hgb A1c MFr Bld: 6.1 % — ABNORMAL HIGH (ref 4.8–5.6)

## 2024-07-04 LAB — LIPID PANEL
Chol/HDL Ratio: 4.1 ratio (ref 0.0–4.4)
Cholesterol, Total: 167 mg/dL (ref 100–199)
HDL: 41 mg/dL (ref >39)
LDL Chol Calc (NIH): 105 mg/dL — ABNORMAL HIGH (ref 0–99)
Triglycerides: 113 mg/dL (ref 0–149)
VLDL Cholesterol Cal: 21 mg/dL (ref 5–40)

## 2024-07-04 LAB — VITAMIN B12: Vitamin B-12: 630 pg/mL (ref 232–1245)

## 2024-07-09 ENCOUNTER — Ambulatory Visit: Payer: Self-pay | Admitting: Family Medicine

## 2024-07-09 ENCOUNTER — Telehealth: Payer: Self-pay

## 2024-07-09 DIAGNOSIS — R7401 Elevation of levels of liver transaminase levels: Secondary | ICD-10-CM

## 2024-07-09 DIAGNOSIS — F419 Anxiety disorder, unspecified: Secondary | ICD-10-CM

## 2024-07-09 DIAGNOSIS — R944 Abnormal results of kidney function studies: Secondary | ICD-10-CM

## 2024-07-09 DIAGNOSIS — N182 Chronic kidney disease, stage 2 (mild): Secondary | ICD-10-CM

## 2024-07-09 NOTE — Telephone Encounter (Signed)
 Copied from CRM #[id number]. Topic: Clinical - Medication Question >> Jul 09, 2024 10:55 AM Sophia H wrote: Reason for CRM: Patient is calling in regarding recent change of medications. States she read the antidepressant can be hard on the kidneys/liver and is wanting to know if it can be switched given the results from her lab work that was concerning for her. Or if PCP can just provide any insight to ease her mind, can be reached via my chart. Ty   Please advise, medications are buPROPion  HCl ER (XL) & naltrexone  (DEPADE) 50 MG tablet

## 2024-07-13 MED ORDER — ESCITALOPRAM OXALATE 10 MG PO TABS: 10.0000 mg | ORAL_TABLET | Freq: Every day | ORAL | 1 refills | Status: DC

## 2024-08-07 ENCOUNTER — Ambulatory Visit: Admitting: Family Medicine

## 2024-08-20 ENCOUNTER — Ambulatory Visit: Admitting: Family Medicine

## 2024-09-23 ENCOUNTER — Encounter: Payer: Self-pay | Admitting: Family Medicine

## 2024-10-07 LAB — COMPREHENSIVE METABOLIC PANEL WITH GFR
ALT: 21 IU/L (ref 0–32)
AST: 17 IU/L (ref 0–40)
Albumin: 3.9 g/dL (ref 3.9–4.9)
Alkaline Phosphatase: 82 IU/L (ref 41–116)
BUN/Creatinine Ratio: 15 (ref 9–23)
BUN: 15 mg/dL (ref 6–24)
Bilirubin Total: 0.7 mg/dL (ref 0.0–1.2)
CO2: 24 mmol/L (ref 20–29)
Calcium: 9.4 mg/dL (ref 8.7–10.2)
Chloride: 100 mmol/L (ref 96–106)
Creatinine, Ser: 0.99 mg/dL (ref 0.57–1.00)
Globulin, Total: 2.9 g/dL (ref 1.5–4.5)
Glucose: 100 mg/dL — ABNORMAL HIGH (ref 70–99)
Potassium: 4.8 mmol/L (ref 3.5–5.2)
Sodium: 137 mmol/L (ref 134–144)
Total Protein: 6.8 g/dL (ref 6.0–8.5)
eGFR: 71 mL/min/1.73 (ref >59)

## 2024-10-20 ENCOUNTER — Other Ambulatory Visit: Payer: Self-pay | Admitting: Family Medicine

## 2024-10-20 DIAGNOSIS — F32A Depression, unspecified: Secondary | ICD-10-CM
